# Patient Record
Sex: Male | Born: 1980 | Race: White | Hispanic: No | Marital: Single | Smoking: Former smoker
Health system: Southern US, Community
[De-identification: ages and names within clinical notes are randomized; demographics above are authoritative.]

## PROBLEM LIST (undated history)

## (undated) DIAGNOSIS — F329 Major depressive disorder, single episode, unspecified: Secondary | ICD-10-CM

## (undated) DIAGNOSIS — F419 Anxiety disorder, unspecified: Secondary | ICD-10-CM

## (undated) DIAGNOSIS — F32A Depression, unspecified: Secondary | ICD-10-CM

## (undated) HISTORY — PX: MOLE REMOVAL: SHX2046

---
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
## 2002-03-31 ENCOUNTER — Ambulatory Visit: Admission: RE | Admit: 2002-03-31 | Discharge: 2002-03-31 | Payer: Self-pay | Admitting: Family Medicine

## 2002-09-22 ENCOUNTER — Ambulatory Visit: Admission: RE | Admit: 2002-09-22 | Discharge: 2002-09-22 | Payer: Self-pay | Admitting: Pulmonary Disease

## 2011-07-03 DIAGNOSIS — D485 Neoplasm of uncertain behavior of skin: Secondary | ICD-10-CM | POA: Diagnosis not present

## 2011-07-03 DIAGNOSIS — D239 Other benign neoplasm of skin, unspecified: Secondary | ICD-10-CM | POA: Diagnosis not present

## 2011-07-03 DIAGNOSIS — D2360 Other benign neoplasm of skin of unspecified upper limb, including shoulder: Secondary | ICD-10-CM | POA: Diagnosis not present

## 2011-10-28 DIAGNOSIS — F321 Major depressive disorder, single episode, moderate: Secondary | ICD-10-CM | POA: Diagnosis not present

## 2011-10-31 DIAGNOSIS — D485 Neoplasm of uncertain behavior of skin: Secondary | ICD-10-CM | POA: Diagnosis not present

## 2011-11-28 DIAGNOSIS — D485 Neoplasm of uncertain behavior of skin: Secondary | ICD-10-CM | POA: Diagnosis not present

## 2012-02-13 DIAGNOSIS — Z23 Encounter for immunization: Secondary | ICD-10-CM | POA: Diagnosis not present

## 2012-04-13 DIAGNOSIS — F321 Major depressive disorder, single episode, moderate: Secondary | ICD-10-CM | POA: Diagnosis not present

## 2012-07-13 ENCOUNTER — Encounter: Payer: Self-pay | Admitting: Pulmonary Disease

## 2012-08-26 DIAGNOSIS — F848 Other pervasive developmental disorders: Secondary | ICD-10-CM | POA: Diagnosis not present

## 2012-08-26 DIAGNOSIS — Z733 Stress, not elsewhere classified: Secondary | ICD-10-CM | POA: Diagnosis not present

## 2012-08-27 DIAGNOSIS — F848 Other pervasive developmental disorders: Secondary | ICD-10-CM | POA: Diagnosis not present

## 2012-08-27 DIAGNOSIS — Z733 Stress, not elsewhere classified: Secondary | ICD-10-CM | POA: Diagnosis not present

## 2012-08-28 DIAGNOSIS — Z733 Stress, not elsewhere classified: Secondary | ICD-10-CM | POA: Diagnosis not present

## 2012-08-28 DIAGNOSIS — F848 Other pervasive developmental disorders: Secondary | ICD-10-CM | POA: Diagnosis not present

## 2012-09-28 DIAGNOSIS — F321 Major depressive disorder, single episode, moderate: Secondary | ICD-10-CM | POA: Diagnosis not present

## 2012-10-28 DIAGNOSIS — F840 Autistic disorder: Secondary | ICD-10-CM | POA: Diagnosis not present

## 2012-11-20 DIAGNOSIS — F840 Autistic disorder: Secondary | ICD-10-CM | POA: Diagnosis not present

## 2013-02-10 DIAGNOSIS — Z23 Encounter for immunization: Secondary | ICD-10-CM | POA: Diagnosis not present

## 2013-03-15 DIAGNOSIS — F321 Major depressive disorder, single episode, moderate: Secondary | ICD-10-CM | POA: Diagnosis not present

## 2013-06-23 DIAGNOSIS — J019 Acute sinusitis, unspecified: Secondary | ICD-10-CM | POA: Diagnosis not present

## 2013-09-29 DIAGNOSIS — F321 Major depressive disorder, single episode, moderate: Secondary | ICD-10-CM | POA: Diagnosis not present

## 2014-02-08 DIAGNOSIS — Z23 Encounter for immunization: Secondary | ICD-10-CM | POA: Diagnosis not present

## 2014-03-16 DIAGNOSIS — F321 Major depressive disorder, single episode, moderate: Secondary | ICD-10-CM | POA: Diagnosis not present

## 2014-08-31 DIAGNOSIS — F321 Major depressive disorder, single episode, moderate: Secondary | ICD-10-CM | POA: Diagnosis not present

## 2015-02-13 DIAGNOSIS — Z23 Encounter for immunization: Secondary | ICD-10-CM | POA: Diagnosis not present

## 2015-02-15 DIAGNOSIS — F321 Major depressive disorder, single episode, moderate: Secondary | ICD-10-CM | POA: Diagnosis not present

## 2015-02-22 DIAGNOSIS — Z1389 Encounter for screening for other disorder: Secondary | ICD-10-CM | POA: Diagnosis not present

## 2015-02-22 DIAGNOSIS — R030 Elevated blood-pressure reading, without diagnosis of hypertension: Secondary | ICD-10-CM | POA: Diagnosis not present

## 2015-02-22 DIAGNOSIS — Z0000 Encounter for general adult medical examination without abnormal findings: Secondary | ICD-10-CM | POA: Diagnosis not present

## 2015-08-30 DIAGNOSIS — F321 Major depressive disorder, single episode, moderate: Secondary | ICD-10-CM | POA: Diagnosis not present

## 2015-10-02 DIAGNOSIS — J069 Acute upper respiratory infection, unspecified: Secondary | ICD-10-CM | POA: Diagnosis not present

## 2015-10-02 DIAGNOSIS — J309 Allergic rhinitis, unspecified: Secondary | ICD-10-CM | POA: Diagnosis not present

## 2016-02-20 DIAGNOSIS — J029 Acute pharyngitis, unspecified: Secondary | ICD-10-CM | POA: Diagnosis not present

## 2016-02-21 DIAGNOSIS — Z23 Encounter for immunization: Secondary | ICD-10-CM | POA: Diagnosis not present

## 2016-03-05 DIAGNOSIS — F321 Major depressive disorder, single episode, moderate: Secondary | ICD-10-CM | POA: Diagnosis not present

## 2016-08-27 DIAGNOSIS — F321 Major depressive disorder, single episode, moderate: Secondary | ICD-10-CM | POA: Diagnosis not present

## 2017-01-21 DIAGNOSIS — Z23 Encounter for immunization: Secondary | ICD-10-CM | POA: Diagnosis not present

## 2017-04-29 DIAGNOSIS — F321 Major depressive disorder, single episode, moderate: Secondary | ICD-10-CM | POA: Diagnosis not present

## 2017-09-14 ENCOUNTER — Other Ambulatory Visit: Payer: Self-pay

## 2017-09-14 ENCOUNTER — Encounter (HOSPITAL_COMMUNITY): Payer: Self-pay | Admitting: Emergency Medicine

## 2017-09-14 ENCOUNTER — Emergency Department (HOSPITAL_COMMUNITY)
Admission: EM | Admit: 2017-09-14 | Discharge: 2017-09-15 | Disposition: A | Discharge status: Home / Self Care | Payer: Medicare Other | Attending: Emergency Medicine | Admitting: Emergency Medicine

## 2017-09-14 DIAGNOSIS — S81811A Laceration without foreign body, right lower leg, initial encounter: Secondary | ICD-10-CM | POA: Diagnosis not present

## 2017-09-14 DIAGNOSIS — Y9289 Other specified places as the place of occurrence of the external cause: Secondary | ICD-10-CM | POA: Insufficient documentation

## 2017-09-14 DIAGNOSIS — W540XXA Bitten by dog, initial encounter: Secondary | ICD-10-CM | POA: Insufficient documentation

## 2017-09-14 DIAGNOSIS — M79605 Pain in left leg: Secondary | ICD-10-CM | POA: Diagnosis not present

## 2017-09-14 DIAGNOSIS — Z87891 Personal history of nicotine dependence: Secondary | ICD-10-CM | POA: Diagnosis not present

## 2017-09-14 DIAGNOSIS — Z23 Encounter for immunization: Secondary | ICD-10-CM | POA: Insufficient documentation

## 2017-09-14 DIAGNOSIS — S8992XA Unspecified injury of left lower leg, initial encounter: Secondary | ICD-10-CM | POA: Diagnosis present

## 2017-09-14 DIAGNOSIS — S81832A Puncture wound without foreign body, left lower leg, initial encounter: Secondary | ICD-10-CM | POA: Diagnosis not present

## 2017-09-14 DIAGNOSIS — Y999 Unspecified external cause status: Secondary | ICD-10-CM | POA: Insufficient documentation

## 2017-09-14 DIAGNOSIS — S81852A Open bite, left lower leg, initial encounter: Secondary | ICD-10-CM | POA: Diagnosis not present

## 2017-09-14 DIAGNOSIS — Y9355 Activity, bike riding: Secondary | ICD-10-CM | POA: Diagnosis not present

## 2017-09-14 NOTE — ED Triage Notes (Signed)
Patient bitten on the back of his L lower leg. Animal Control contacted by [employer]. Patient seen by paramedics and sent to ED.

## 2017-09-15 DIAGNOSIS — S81832A Puncture wound without foreign body, left lower leg, initial encounter: Secondary | ICD-10-CM | POA: Diagnosis not present

## 2017-09-15 MED ORDER — AMOXICILLIN-POT CLAVULANATE 875-125 MG PO TABS
1.0000 | ORAL_TABLET | Freq: Once | ORAL | Status: AC
  Administered 2017-09-15: 1 via ORAL
  Filled 2017-09-15: qty 1

## 2017-09-15 MED ORDER — IBUPROFEN 800 MG PO TABS: 800.0000 mg | ORAL_TABLET | Freq: Four times a day (QID) | ORAL | 0 refills | Status: DC | PRN

## 2017-09-15 MED ORDER — BACITRACIN-NEOMYCIN-POLYMYXIN 400-5-5000 EX OINT
TOPICAL_OINTMENT | Freq: Once | CUTANEOUS | Status: AC
  Administered 2017-09-15: 1 via TOPICAL
  Filled 2017-09-15: qty 1

## 2017-09-15 MED ORDER — AMOXICILLIN-POT CLAVULANATE 875-125 MG PO TABS: 1.0000 | ORAL_TABLET | Freq: Two times a day (BID) | ORAL | 0 refills | Status: DC

## 2017-09-15 MED ORDER — TETANUS-DIPHTH-ACELL PERTUSSIS 5-2.5-18.5 LF-MCG/0.5 IM SUSP
0.5000 mL | Freq: Once | INTRAMUSCULAR | Status: AC
  Administered 2017-09-15: 0.5 mL via INTRAMUSCULAR
  Filled 2017-09-15: qty 0.5
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
## 2017-09-15 NOTE — ED Notes (Signed)
Wound cleaned with saline, nonadherent dressing applied

## 2018-01-06 DIAGNOSIS — F321 Major depressive disorder, single episode, moderate: Secondary | ICD-10-CM | POA: Diagnosis not present

## 2018-01-16 DIAGNOSIS — Z23 Encounter for immunization: Secondary | ICD-10-CM | POA: Diagnosis not present

## 2018-12-24 DIAGNOSIS — E785 Hyperlipidemia, unspecified: Secondary | ICD-10-CM | POA: Diagnosis not present

## 2018-12-24 DIAGNOSIS — Z0000 Encounter for general adult medical examination without abnormal findings: Secondary | ICD-10-CM | POA: Diagnosis not present

## 2019-01-05 ENCOUNTER — Encounter: Payer: Self-pay | Admitting: Psychiatry

## 2019-01-05 ENCOUNTER — Ambulatory Visit (INDEPENDENT_AMBULATORY_CARE_PROVIDER_SITE_OTHER): Payer: Medicare Other | Admitting: Psychiatry

## 2019-01-05 ENCOUNTER — Other Ambulatory Visit: Payer: Self-pay

## 2019-01-05 DIAGNOSIS — F959 Tic disorder, unspecified: Secondary | ICD-10-CM

## 2019-01-05 DIAGNOSIS — G4726 Circadian rhythm sleep disorder, shift work type: Secondary | ICD-10-CM | POA: Diagnosis not present

## 2019-01-05 DIAGNOSIS — F0630 Mood disorder due to known physiological condition, unspecified: Secondary | ICD-10-CM | POA: Diagnosis not present

## 2019-01-05 MED ORDER — MODAFINIL 100 MG PO TABS: 100.0000 mg | ORAL_TABLET | Freq: Every day | ORAL | 1 refills | Status: DC

## 2019-01-05 MED ORDER — FLUOXETINE HCL 10 MG PO CAPS: 10.0000 mg | ORAL_CAPSULE | Freq: Every day | ORAL | 3 refills | Status: DC

## 2019-01-05 NOTE — Progress Notes (Signed)
TRAPPER GOING [id number] 1981/03/07 38 y.o.  Subjective:   Patient ID:  Micheal Mcgee is a 38 y.o. (DOB 02-07-1981) male.  Chief Complaint:  Chief Complaint  Patient presents with  . Follow-up     Medication Management    HPI [name] presents to the office today for follow-up of shift work sleep disorder, tic disorder, mood disorder NOS, and Asberger's.  Last seen August 2019.  No meds were changed.  He remains on fluoxetine 10 mg and modafinil 100 mg.  He has been on the current meds without change since 2007  Still working now.  Brief lay off.  Modafanil still helps alertness.  No excessive sleepiness driving.  Occ anger but no severe outbursts as in the past.  Patient reports stable mood and denies depressed or irritable moods. No unusual stressors except Covid.  Patient denies any recent difficulty with anxiety.  Patient denies difficulty with sleep initiation or maintenance. Denies appetite disturbance.  Patient reports that energy and motivation have been good.  Patient denies any difficulty with concentration.  Patient denies any suicidal ideation.  Past Psychiatric Medication Trials: Citalopram, venlafaxine, sertraline, fluoxetine, Adderall, Concerta, modafinil, risperidone 2, temazepam  Review of Systems:  Review of Systems  Neurological: Negative for tremors and weakness.  Psychiatric/Behavioral: Negative for agitation and behavioral problems.    Medications: I have reviewed the patient's current medications.  Current Outpatient Medications  Medication Sig Dispense Refill  . FLUoxetine (PROZAC) 10 MG capsule Take 10 mg by mouth daily.    . modafinil (PROVIGIL) 100 MG tablet Take 100 mg by mouth daily.     No current facility-administered medications for this visit.     Medication Side Effects: None  Allergies: No Known Allergies  History reviewed. No pertinent past medical history.  Family History  Problem Relation Age of Onset  . Diabetes Mother   .  Diabetes Father   . Cancer Father     Social History   Socioeconomic History  . Marital status: Single    Spouse name: Not on file  . Number of children: Not on file  . Years of education: Not on file  . Highest education level: Not on file  Occupational History  . Not on file  Social Needs  . Financial resource strain: Not on file  . Food insecurity    Worry: Not on file    Inability: Not on file  . Transportation needs    Medical: Not on file    Non-medical: Not on file  Tobacco Use  . Smoking status: Former Research scientist (life sciences)  . Smokeless tobacco: Never Used  Substance and Sexual Activity  . Alcohol use: Yes    Comment: occas  . Drug use: Never  . Sexual activity: Not on file  Lifestyle  . Physical activity    Days per week: Not on file    Minutes per session: Not on file  . Stress: Not on file  Relationships  . Social Herbalist on phone: Not on file    Gets together: Not on file    Attends religious service: Not on file    Active member of club or organization: Not on file    Attends meetings of clubs or organizations: Not on file    Relationship status: Not on file  . Intimate partner violence    Fear of current or ex partner: Not on file    Emotionally abused: Not on file    Physically abused:  Not on file    Forced sexual activity: Not on file  Other Topics Concern  . Not on file  Social History Narrative  . Not on file    Past Medical History, Surgical history, Social history, and Family history were reviewed and updated as appropriate.   Please see review of systems for further details on the patient's review from today.   Objective:   Physical Exam:  There were no vitals taken for this visit.  Physical Exam Constitutional:      General: He is not in acute distress.    Appearance: He is well-developed.  Musculoskeletal:        General: No deformity.  Neurological:     Mental Status: He is alert and oriented to person, place, and time.      Coordination: Coordination normal.  Psychiatric:        Attention and Perception: Attention and perception normal. He does not perceive auditory or visual hallucinations.        Mood and Affect: Mood is not anxious or depressed. Affect is not labile, blunt, angry or inappropriate.        Speech: Speech normal.        Behavior: Behavior normal. Behavior is not agitated.        Thought Content: Thought content normal. Thought content is not paranoid or delusional. Thought content does not include homicidal or suicidal ideation. Thought content does not include homicidal or suicidal plan.        Cognition and Memory: Cognition and memory normal.        Judgment: Judgment normal.     Comments: Insight fair to good. Mildly unusual affect and social deficits. No agitation     Lab Review:  No results found for: NA, K, CL, CO2, GLUCOSE, BUN, CREATININE, CALCIUM, PROT, ALBUMIN, AST, ALT, ALKPHOS, BILITOT, GFRNONAA, GFRAA  No results found for: WBC, RBC, HGB, HCT, PLT, MCV, MCH, MCHC, RDW, LYMPHSABS, MONOABS, EOSABS, BASOSABS  No results found for: POCLITH, LITHIUM   No results found for: PHENYTOIN, PHENOBARB, VALPROATE, CBMZ   .res Assessment: Plan:    Mariusz was seen today for follow-up.  Diagnoses and all orders for this visit:  Shift work sleep disorder  Tic disorder  Mood disorder in conditions classified elsewhere  Patient is a history of Asperger's syndrome which was associated with impulsive and potentially dangerous behavior.  However with age and the addition of fluoxetine he no longer has acting out behaviors.  He still has other symptoms related to Asberger's but he is functioning adequately at this point.  He still lives with his mother.  The modafinil is working well for his shift work sleep disorder.  He is getting an adequate amount of sleep.  He is tolerating the meds.  His tics are not significantly bothersome and do not warrant treatment at this point.  He has taken  risperidone in the past both for tics and impulsive behaviors but he no longer needs that medication.  Overall meds still helpful and risk of changes are greater than risk of continuing meds.  FU 1 year  Lynder Parents, MD, DFAPA  Please see After Visit Summary for patient specific instructions.  No future appointments.  No orders of the defined types were placed in this encounter.   -------------------------------

## 2019-01-11 DIAGNOSIS — Z23 Encounter for immunization: Secondary | ICD-10-CM | POA: Diagnosis not present

## 2019-01-19 ENCOUNTER — Emergency Department (HOSPITAL_COMMUNITY)
Admission: EM | Admit: 2019-01-19 | Discharge: 2019-01-19 | Disposition: A | Discharge status: Home / Self Care | Payer: Medicare Other | Attending: Emergency Medicine | Admitting: Emergency Medicine

## 2019-01-19 ENCOUNTER — Emergency Department (HOSPITAL_COMMUNITY): Payer: Medicare Other

## 2019-01-19 ENCOUNTER — Other Ambulatory Visit: Payer: Self-pay

## 2019-01-19 ENCOUNTER — Encounter (HOSPITAL_COMMUNITY): Payer: Self-pay | Admitting: *Deleted

## 2019-01-19 DIAGNOSIS — S42021A Displaced fracture of shaft of right clavicle, initial encounter for closed fracture: Secondary | ICD-10-CM

## 2019-01-19 DIAGNOSIS — Y9355 Activity, bike riding: Secondary | ICD-10-CM | POA: Diagnosis not present

## 2019-01-19 DIAGNOSIS — Z87891 Personal history of nicotine dependence: Secondary | ICD-10-CM | POA: Insufficient documentation

## 2019-01-19 DIAGNOSIS — Y92410 Unspecified street and highway as the place of occurrence of the external cause: Secondary | ICD-10-CM | POA: Diagnosis not present

## 2019-01-19 DIAGNOSIS — Y999 Unspecified external cause status: Secondary | ICD-10-CM | POA: Diagnosis not present

## 2019-01-19 DIAGNOSIS — S4991XA Unspecified injury of right shoulder and upper arm, initial encounter: Secondary | ICD-10-CM | POA: Diagnosis present

## 2019-01-19 MED ORDER — HYDROCODONE-ACETAMINOPHEN 5-325 MG PO TABS
1.0000 | ORAL_TABLET | Freq: Once | ORAL | Status: AC
  Administered 2019-01-19: 02:00:00 1 via ORAL
  Filled 2019-01-19: qty 1

## 2019-01-19 MED ORDER — HYDROCODONE-ACETAMINOPHEN 5-325 MG PO TABS: 1.0000 | ORAL_TABLET | ORAL | 0 refills | Status: DC | PRN
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
## 2019-01-19 NOTE — Discharge Instructions (Signed)
As we discussed, you have a severe fracture of your collar bone. It is at risk for poking through the skin. Wear the sling and do not move the arm as much as possible. Apply ice and take the pain medication as prescribed. If you notice the bone poking through the skin or any bleeding from the area, return to the ED right away.
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
## 2019-01-20 ENCOUNTER — Other Ambulatory Visit: Payer: Self-pay | Admitting: Physician Assistant

## 2019-01-20 ENCOUNTER — Other Ambulatory Visit (HOSPITAL_COMMUNITY)
Admission: RE | Admit: 2019-01-20 | Discharge: 2019-01-20 | Disposition: A | Discharge status: Home / Self Care | Payer: Medicare Other | Source: Ambulatory Visit | Attending: Orthopaedic Surgery | Admitting: Orthopaedic Surgery

## 2019-01-20 ENCOUNTER — Ambulatory Visit (INDEPENDENT_AMBULATORY_CARE_PROVIDER_SITE_OTHER): Payer: Medicare Other | Admitting: Orthopaedic Surgery

## 2019-01-20 ENCOUNTER — Encounter: Payer: Self-pay | Admitting: Orthopaedic Surgery

## 2019-01-20 DIAGNOSIS — Z20828 Contact with and (suspected) exposure to other viral communicable diseases: Secondary | ICD-10-CM | POA: Insufficient documentation

## 2019-01-20 DIAGNOSIS — S42021A Displaced fracture of shaft of right clavicle, initial encounter for closed fracture: Secondary | ICD-10-CM

## 2019-01-20 DIAGNOSIS — Z01812 Encounter for preprocedural laboratory examination: Secondary | ICD-10-CM | POA: Insufficient documentation

## 2019-01-20 LAB — SARS CORONAVIRUS 2 (TAT 6-24 HRS): SARS Coronavirus 2: NEGATIVE
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
## 2019-01-21 ENCOUNTER — Encounter (HOSPITAL_COMMUNITY): Payer: Self-pay | Admitting: *Deleted

## 2019-01-21 ENCOUNTER — Encounter (HOSPITAL_COMMUNITY)
Admission: RE | Admit: 2019-01-21 | Discharge: 2019-01-21 | Disposition: A | Discharge status: Home / Self Care | Payer: Medicare Other | Source: Ambulatory Visit | Attending: Orthopaedic Surgery | Admitting: Orthopaedic Surgery

## 2019-01-21 ENCOUNTER — Other Ambulatory Visit: Payer: Self-pay

## 2019-01-21 ENCOUNTER — Encounter (HOSPITAL_COMMUNITY): Payer: Self-pay

## 2019-01-21 DIAGNOSIS — S42001B Fracture of unspecified part of right clavicle, initial encounter for open fracture: Secondary | ICD-10-CM | POA: Diagnosis not present

## 2019-01-21 DIAGNOSIS — Z01812 Encounter for preprocedural laboratory examination: Secondary | ICD-10-CM | POA: Insufficient documentation

## 2019-01-21 HISTORY — DX: Anxiety disorder, unspecified: F41.9

## 2019-01-21 HISTORY — DX: Depression, unspecified: F32.A

## 2019-01-21 LAB — CBC
HCT: 46 % (ref 39.0–52.0)
Hemoglobin: 15.6 g/dL (ref 13.0–17.0)
MCH: 32.1 pg (ref 26.0–34.0)
MCHC: 33.9 g/dL (ref 30.0–36.0)
MCV: 94.7 fL (ref 80.0–100.0)
Platelets: 210 10*3/uL (ref 150–400)
RBC: 4.86 MIL/uL (ref 4.22–5.81)
RDW: 12.9 % (ref 11.5–15.5)
WBC: 7.2 10*3/uL (ref 4.0–10.5)
nRBC: 0 % (ref 0.0–0.2)

## 2019-01-21 LAB — BASIC METABOLIC PANEL
Anion gap: 11 (ref 5–15)
BUN: 11 mg/dL (ref 6–20)
CO2: 26 mmol/L (ref 22–32)
Calcium: 9 mg/dL (ref 8.9–10.3)
Chloride: 102 mmol/L (ref 98–111)
Creatinine, Ser: 0.86 mg/dL (ref 0.61–1.24)
GFR calc Af Amer: >60 mL/min (ref >60)
GFR calc non Af Amer: >60 mL/min (ref >60)
Glucose, Bld: 134 mg/dL — ABNORMAL HIGH (ref 70–99)
Potassium: 4.1 mmol/L (ref 3.5–5.1)
Sodium: 139 mmol/L (ref 135–145)

## 2019-01-21 NOTE — Progress Notes (Signed)
Pt aware of surgical time change and to arrive at [facility] admitting by 0715 on 01/22/2019. Pt aware to consume ERAS drink by 0630 to 0645 and to take medications as instructed during PAT appt. Otherwise no food or drink after midnight. Pt verbalized understanding.

## 2019-01-21 NOTE — Patient Instructions (Addendum)
DUE TO COVID-19 ONLY ONE VISITOR IS ALLOWED TO COME WITH YOU AND STAY IN THE WAITING ROOM ONLY DURING PRE OP AND PROCEDURE DAY OF SURGERY. THE 1 VISITOR MAY VISIT WITH YOU AFTER SURGERY IN YOUR PRIVATE ROOM DURING VISITING HOURS ONLY!   ONCE YOUR COVID TEST IS COMPLETED, PLEASE BEGIN THE QUARANTINE INSTRUCTIONS AS OUTLINED IN YOUR HANDOUT.                Micheal Mcgee  01/21/2019   Your procedure is scheduled on: Friday 01/22/2019   Report to [facility] Main  Entrance              Report to admitting at 1015 AM     Call this number if you have problems the morning of surgery [phone]    Remember: Do not eat food  :After Midnight.               NO SOLID FOOD AFTER MIDNIGHT THE NIGHT PRIOR TO SURGERY. NOTHING BY MOUTH EXCEPT CLEAR LIQUIDS FROM MIDNIGHT UP  UNTIL 0915 AM .              PLEASE FINISH ENSURE DRINK PER SURGEON ORDER  WHICH NEEDS TO BE COMPLETED AT  0915 AM.   CLEAR LIQUID DIET   Foods Allowed                                                                     Foods Excluded  Coffee and tea, regular and decaf                             liquids that you cannot  Plain Jell-O any favor except red or purple                                           see through such as: Fruit ices (not with fruit pulp)                                     milk, soups, orange juice  Iced Popsicles                                    All solid food Carbonated beverages, regular and diet                                    Cranberry, grape and apple juices Sports drinks like Gatorade Lightly seasoned clear broth or consume(fat free) Sugar, honey syrup  Sample Menu Breakfast                                Lunch  Supper Cranberry juice                    Beef broth                            Chicken broth Jell-O                                     Grape juice                           Apple juice Coffee or tea                        Jell-O                                       Popsicle                                                Coffee or tea                        Coffee or tea  _____________________________________________________________________               BRUSH YOUR TEETH MORNING OF SURGERY AND RINSE YOUR MOUTH OUT, NO CHEWING GUM CANDY OR MINTS.     Take these medicines the morning of surgery with A SIP OF WATER: Fluoxetine (Prozac)                                 You may not have any metal on your body including hair pins and              piercings  Do not wear jewelry, make-up, lotions, powders or perfumes, deodorant                       Men may shave face and neck.   Do not bring valuables to the hospital. [place].  Contacts, dentures or bridgework may not be worn into surgery.  Leave suitcase in the car. After surgery it may be brought to your room.                  Please read over the following fact sheets you were given: _____________________________________________________________________             [facility] - Preparing for Surgery Before surgery, you can play an important role.  Because skin is not sterile, your skin needs to be as free of germs as possible.  You can reduce the number of germs on your skin by washing with CHG (chlorahexidine gluconate) soap before surgery.  CHG is an antiseptic cleaner which kills germs and bonds with the skin to continue killing germs even after washing. Please DO NOT use if you have an allergy to CHG or antibacterial soaps.  If your skin becomes reddened/irritated stop  using the CHG and inform your nurse when you arrive at Short Stay. Do not shave (including legs and underarms) for at least 48 hours prior to the first CHG shower.  You may shave your face/neck. Please follow these instructions carefully:  1.  Shower with CHG Soap the night before surgery and the  morning of Surgery.  2.  If you choose to wash  your hair, wash your hair first as usual with your  normal  shampoo.  3.  After you shampoo, rinse your hair and body thoroughly to remove the  shampoo.                           4.  Use CHG as you would any other liquid soap.  You can apply chg directly  to the skin and wash                       Gently with a scrungie or clean washcloth.  5.  Apply the CHG Soap to your body ONLY FROM THE NECK DOWN.   Do not use on face/ open                           Wound or open sores. Avoid contact with eyes, ears mouth and genitals (private parts).                       Wash face,  Genitals (private parts) with your normal soap.             6.  Wash thoroughly, paying special attention to the area where your surgery  will be performed.  7.  Thoroughly rinse your body with warm water from the neck down.  8.  DO NOT shower/wash with your normal soap after using and rinsing off  the CHG Soap.                9.  Pat yourself dry with a clean towel.            10.  Wear clean pajamas.            11.  Place clean sheets on your bed the night of your first shower and do not  sleep with pets. Day of Surgery : Do not apply any lotions/deodorants the morning of surgery.  Please wear clean clothes to the hospital/surgery center.  FAILURE TO FOLLOW THESE INSTRUCTIONS MAY RESULT IN THE CANCELLATION OF YOUR SURGERY PATIENT SIGNATURE_________________________________  NURSE SIGNATURE__________________________________  ________________________________________________________________________   Micheal Mcgee  An incentive spirometer is a tool that can help keep your lungs clear and active. This tool measures how well you are filling your lungs with each breath. Taking long deep breaths may help reverse or decrease the chance of developing breathing (pulmonary) problems (especially infection) following:  A long period of time when you are unable to move or be active. BEFORE THE PROCEDURE   If the spirometer  includes an indicator to show your best effort, your nurse or respiratory therapist will set it to a desired goal.  If possible, sit up straight or lean slightly forward. Try not to slouch.  Hold the incentive spirometer in an upright position. INSTRUCTIONS FOR USE  1. Sit on the edge of your bed if possible, or sit up as far as you can in bed or on a chair. 2. Hold the incentive  spirometer in an upright position. 3. Breathe out normally. 4. Place the mouthpiece in your mouth and seal your lips tightly around it. 5. Breathe in slowly and as deeply as possible, raising the piston or the ball toward the top of the column. 6. Hold your breath for 3-5 seconds or for as long as possible. Allow the piston or ball to fall to the bottom of the column. 7. Remove the mouthpiece from your mouth and breathe out normally. 8. Rest for a few seconds and repeat Steps 1 through 7 at least 10 times every 1-2 hours when you are awake. Take your time and take a few normal breaths between deep breaths. 9. The spirometer may include an indicator to show your best effort. Use the indicator as a goal to work toward during each repetition. 10. After each set of 10 deep breaths, practice coughing to be sure your lungs are clear. If you have an incision (the cut made at the time of surgery), support your incision when coughing by placing a pillow or rolled up towels firmly against it. Once you are able to get out of bed, walk around indoors and cough well. You may stop using the incentive spirometer when instructed by your caregiver.  RISKS AND COMPLICATIONS  Take your time so you do not get dizzy or light-headed.  If you are in pain, you may need to take or ask for pain medication before doing incentive spirometry. It is harder to take a deep breath if you are having pain. AFTER USE  Rest and breathe slowly and easily.  It can be helpful to keep track of a log of your progress. Your caregiver can provide you with a  simple table to help with this. If you are using the spirometer at home, follow these instructions: [place] IF:   You are having difficultly using the spirometer.  You have trouble using the spirometer as often as instructed.  Your pain medication is not giving enough relief while using the spirometer.  You develop fever of 100.5 F (38.1 C) or higher. SEEK IMMEDIATE MEDICAL CARE IF:   You cough up bloody sputum that had not been present before.  You develop fever of 102 F (38.9 C) or greater.  You develop worsening pain at or near the incision site. MAKE SURE YOU:   Understand these instructions.  Will watch your condition.  Will get help right away if you are not doing well or get worse. Document Released: 09/09/2006 Document Revised: 07/22/2011 Document Reviewed: 11/10/2006 [facility] Patient Information 2014 [place], [place].   ________________________________________________________________________

## 2019-01-21 NOTE — Progress Notes (Signed)
PCP - Dr. Jeremy Johann Cardiologist -N/A   Chest x-ray -N/A  EKG - N/A Stress Test - N/A ECHO - N/A Cardiac Cath - N/A  Sleep Study -N/A  CPAP -   Fasting Blood Sugar - N/A Checks Blood Sugar _____ times a day  Blood Thinner Instructions:N/A Aspirin Instructions: Last Dose:  Anesthesia review:   Patient denies shortness of breath, fever, cough and chest pain at PAT appointment   Patient verbalized understanding of instructions that were given to them at the PAT appointment. Patient was also instructed that they will need to review over the PAT instructions again at home before surgery.

## 2019-01-22 ENCOUNTER — Other Ambulatory Visit: Payer: Self-pay

## 2019-01-22 ENCOUNTER — Ambulatory Visit (HOSPITAL_COMMUNITY): Payer: Medicare Other | Admitting: Anesthesiology

## 2019-01-22 ENCOUNTER — Encounter (HOSPITAL_COMMUNITY)
Admission: AC | Disposition: A | Discharge status: Home / Self Care | Payer: Self-pay | Source: Home / Self Care | Attending: Orthopaedic Surgery

## 2019-01-22 ENCOUNTER — Encounter (HOSPITAL_COMMUNITY): Payer: Self-pay | Admitting: *Deleted

## 2019-01-22 ENCOUNTER — Ambulatory Visit (HOSPITAL_COMMUNITY): Payer: Medicare Other

## 2019-01-22 ENCOUNTER — Observation Stay (HOSPITAL_COMMUNITY)
Admission: AC | Admit: 2019-01-22 | Discharge: 2019-01-23 | Disposition: A | Discharge status: Home / Self Care | Payer: Medicare Other | Attending: Orthopaedic Surgery | Admitting: Orthopaedic Surgery

## 2019-01-22 DIAGNOSIS — Z79899 Other long term (current) drug therapy: Secondary | ICD-10-CM | POA: Diagnosis not present

## 2019-01-22 DIAGNOSIS — S42021A Displaced fracture of shaft of right clavicle, initial encounter for closed fracture: Principal | ICD-10-CM | POA: Insufficient documentation

## 2019-01-22 DIAGNOSIS — Z87891 Personal history of nicotine dependence: Secondary | ICD-10-CM | POA: Insufficient documentation

## 2019-01-22 DIAGNOSIS — S42009A Fracture of unspecified part of unspecified clavicle, initial encounter for closed fracture: Secondary | ICD-10-CM

## 2019-01-22 DIAGNOSIS — F329 Major depressive disorder, single episode, unspecified: Secondary | ICD-10-CM | POA: Diagnosis not present

## 2019-01-22 DIAGNOSIS — S42001A Fracture of unspecified part of right clavicle, initial encounter for closed fracture: Secondary | ICD-10-CM | POA: Diagnosis present

## 2019-01-22 DIAGNOSIS — F419 Anxiety disorder, unspecified: Secondary | ICD-10-CM | POA: Diagnosis not present

## 2019-01-22 DIAGNOSIS — F418 Other specified anxiety disorders: Secondary | ICD-10-CM | POA: Diagnosis not present

## 2019-01-22 DIAGNOSIS — Y9355 Activity, bike riding: Secondary | ICD-10-CM | POA: Diagnosis not present

## 2019-01-22 DIAGNOSIS — S42001D Fracture of unspecified part of right clavicle, subsequent encounter for fracture with routine healing: Secondary | ICD-10-CM | POA: Diagnosis not present

## 2019-01-22 HISTORY — PX: ORIF CLAVICULAR FRACTURE: SHX5055

## 2019-01-22 SURGERY — OPEN REDUCTION INTERNAL FIXATION (ORIF) CLAVICULAR FRACTURE
Anesthesia: General | Site: Shoulder | Laterality: Right

## 2019-01-22 MED ORDER — BUPIVACAINE HCL (PF) 0.25 % IJ SOLN
INTRAMUSCULAR | Status: AC
  Filled 2019-01-22: qty 30

## 2019-01-22 MED ORDER — SODIUM CHLORIDE 0.9 % IV SOLN
INTRAVENOUS | Status: DC
  Administered 2019-01-22: 18:00:00 via INTRAVENOUS

## 2019-01-22 MED ORDER — 0.9 % SODIUM CHLORIDE (POUR BTL) OPTIME
TOPICAL | Status: DC | PRN
  Administered 2019-01-22: 12:00:00 1000 mL

## 2019-01-22 MED ORDER — CEFAZOLIN SODIUM-DEXTROSE 1-4 GM/50ML-% IV SOLN
1.0000 g | Freq: Four times a day (QID) | INTRAVENOUS | Status: AC
  Administered 2019-01-22 – 2019-01-23 (×3): 1 g via INTRAVENOUS
  Filled 2019-01-22 (×3): qty 50

## 2019-01-22 MED ORDER — ACETAMINOPHEN 325 MG PO TABS
325.0000 mg | ORAL_TABLET | Freq: Four times a day (QID) | ORAL | Status: DC | PRN
  Filled 2019-01-22: qty 2

## 2019-01-22 MED ORDER — OXYCODONE HCL 5 MG PO TABS
5.0000 mg | ORAL_TABLET | ORAL | Status: DC | PRN
  Administered 2019-01-22: 19:00:00 5 mg via ORAL
  Administered 2019-01-22 – 2019-01-23 (×2): 10 mg via ORAL
  Filled 2019-01-22: qty 1
  Filled 2019-01-22 (×2): qty 2

## 2019-01-22 MED ORDER — ONDANSETRON HCL 4 MG PO TABS: 4.0000 mg | ORAL_TABLET | Freq: Four times a day (QID) | ORAL | Status: DC | PRN

## 2019-01-22 MED ORDER — PROPOFOL 10 MG/ML IV BOLUS
INTRAVENOUS | Status: DC | PRN
  Administered 2019-01-22: 200 mg via INTRAVENOUS

## 2019-01-22 MED ORDER — FENTANYL CITRATE (PF) 100 MCG/2ML IJ SOLN
INTRAMUSCULAR | Status: AC
  Filled 2019-01-22: qty 2

## 2019-01-22 MED ORDER — CEFAZOLIN SODIUM-DEXTROSE 2-4 GM/100ML-% IV SOLN
2.0000 g | INTRAVENOUS | Status: AC
  Administered 2019-01-22: 2 g via INTRAVENOUS
  Filled 2019-01-22: qty 100

## 2019-01-22 MED ORDER — OXYCODONE HCL 5 MG PO TABS: 10.0000 mg | ORAL_TABLET | ORAL | Status: DC | PRN

## 2019-01-22 MED ORDER — MIDAZOLAM HCL 2 MG/2ML IJ SOLN
INTRAMUSCULAR | Status: AC
  Filled 2019-01-22: qty 2

## 2019-01-22 MED ORDER — MODAFINIL 100 MG PO TABS: 100.0000 mg | ORAL_TABLET | Freq: Every day | ORAL | Status: DC

## 2019-01-22 MED ORDER — METHOCARBAMOL 500 MG PO TABS
500.0000 mg | ORAL_TABLET | Freq: Four times a day (QID) | ORAL | Status: DC | PRN
  Administered 2019-01-22 – 2019-01-23 (×2): 500 mg via ORAL
  Filled 2019-01-22 (×2): qty 1

## 2019-01-22 MED ORDER — FENTANYL CITRATE (PF) 100 MCG/2ML IJ SOLN
INTRAMUSCULAR | Status: DC | PRN
  Administered 2019-01-22 (×5): 50 ug via INTRAVENOUS
  Administered 2019-01-22: 25 ug via INTRAVENOUS
  Administered 2019-01-22: 50 ug via INTRAVENOUS
  Administered 2019-01-22: 25 ug via INTRAVENOUS
  Administered 2019-01-22 (×2): 50 ug via INTRAVENOUS

## 2019-01-22 MED ORDER — ONDANSETRON HCL 4 MG/2ML IJ SOLN: 4.0000 mg | Freq: Four times a day (QID) | INTRAMUSCULAR | Status: DC | PRN

## 2019-01-22 MED ORDER — DEXAMETHASONE SODIUM PHOSPHATE 10 MG/ML IJ SOLN
INTRAMUSCULAR | Status: DC | PRN
  Administered 2019-01-22: 10 mg via INTRAVENOUS

## 2019-01-22 MED ORDER — HYDROMORPHONE HCL 1 MG/ML IJ SOLN: 0.5000 mg | INTRAMUSCULAR | Status: DC | PRN

## 2019-01-22 MED ORDER — ONDANSETRON HCL 4 MG/2ML IJ SOLN
INTRAMUSCULAR | Status: AC
  Filled 2019-01-22: qty 2

## 2019-01-22 MED ORDER — METHOCARBAMOL 500 MG IVPB - SIMPLE MED
500.0000 mg | Freq: Four times a day (QID) | INTRAVENOUS | Status: DC | PRN
  Filled 2019-01-22: qty 50

## 2019-01-22 MED ORDER — DEXAMETHASONE SODIUM PHOSPHATE 10 MG/ML IJ SOLN
INTRAMUSCULAR | Status: AC
  Filled 2019-01-22: qty 1

## 2019-01-22 MED ORDER — FENTANYL CITRATE (PF) 250 MCG/5ML IJ SOLN
INTRAMUSCULAR | Status: AC
  Filled 2019-01-22: qty 5

## 2019-01-22 MED ORDER — FLUOXETINE HCL 10 MG PO CAPS
10.0000 mg | ORAL_CAPSULE | Freq: Every day | ORAL | Status: DC
  Administered 2019-01-23: 10 mg via ORAL
  Filled 2019-01-22 (×2): qty 1

## 2019-01-22 MED ORDER — LACTATED RINGERS IV SOLN
INTRAVENOUS | Status: DC
  Administered 2019-01-22 (×2): via INTRAVENOUS

## 2019-01-22 MED ORDER — PROPOFOL 10 MG/ML IV BOLUS
INTRAVENOUS | Status: AC
  Filled 2019-01-22: qty 20

## 2019-01-22 MED ORDER — MIDAZOLAM HCL 5 MG/5ML IJ SOLN
INTRAMUSCULAR | Status: DC | PRN
  Administered 2019-01-22: 2 mg via INTRAVENOUS

## 2019-01-22 MED ORDER — PHENYLEPHRINE 40 MCG/ML (10ML) SYRINGE FOR IV PUSH (FOR BLOOD PRESSURE SUPPORT)
PREFILLED_SYRINGE | INTRAVENOUS | Status: DC | PRN
  Administered 2019-01-22: 80 ug via INTRAVENOUS

## 2019-01-22 MED ORDER — BUPIVACAINE HCL (PF) 0.25 % IJ SOLN
INTRAMUSCULAR | Status: DC | PRN
  Administered 2019-01-22: 20 mL

## 2019-01-22 MED ORDER — FENTANYL CITRATE (PF) 100 MCG/2ML IJ SOLN
25.0000 ug | INTRAMUSCULAR | Status: DC | PRN
  Administered 2019-01-22 (×2): 50 ug via INTRAVENOUS

## 2019-01-22 MED ORDER — DOCUSATE SODIUM 100 MG PO CAPS
100.0000 mg | ORAL_CAPSULE | Freq: Two times a day (BID) | ORAL | Status: DC
  Administered 2019-01-22 – 2019-01-23 (×2): 100 mg via ORAL
  Filled 2019-01-22 (×2): qty 1

## 2019-01-22 MED ORDER — PHENYLEPHRINE 40 MCG/ML (10ML) SYRINGE FOR IV PUSH (FOR BLOOD PRESSURE SUPPORT)
PREFILLED_SYRINGE | INTRAVENOUS | Status: AC
  Filled 2019-01-22: qty 10

## 2019-01-22 MED ORDER — METOCLOPRAMIDE HCL 5 MG PO TABS: 5.0000 mg | ORAL_TABLET | Freq: Three times a day (TID) | ORAL | Status: DC | PRN

## 2019-01-22 MED ORDER — LIDOCAINE 2% (20 MG/ML) 5 ML SYRINGE
INTRAMUSCULAR | Status: AC
  Filled 2019-01-22: qty 5

## 2019-01-22 MED ORDER — METOCLOPRAMIDE HCL 5 MG/ML IJ SOLN: 5.0000 mg | Freq: Three times a day (TID) | INTRAMUSCULAR | Status: DC | PRN

## 2019-01-22 MED ORDER — ROCURONIUM BROMIDE 10 MG/ML (PF) SYRINGE
PREFILLED_SYRINGE | INTRAVENOUS | Status: AC
  Filled 2019-01-22: qty 10

## 2019-01-22 MED ORDER — ACETAMINOPHEN 500 MG PO TABS
1000.0000 mg | ORAL_TABLET | Freq: Once | ORAL | Status: AC
  Administered 2019-01-22: 10:00:00 1000 mg via ORAL
  Filled 2019-01-22: qty 2

## 2019-01-22 MED ORDER — ONDANSETRON HCL 4 MG/2ML IJ SOLN
INTRAMUSCULAR | Status: DC | PRN
  Administered 2019-01-22: 4 mg via INTRAVENOUS

## 2019-01-22 MED ORDER — DIPHENHYDRAMINE HCL 12.5 MG/5ML PO ELIX: 12.5000 mg | ORAL_SOLUTION | ORAL | Status: DC | PRN

## 2019-01-22 MED ORDER — CHLORHEXIDINE GLUCONATE 4 % EX LIQD: 60.0000 mL | Freq: Once | CUTANEOUS | Status: DC

## 2019-01-22 MED ORDER — MIDAZOLAM HCL 2 MG/2ML IJ SOLN
0.5000 mg | Freq: Once | INTRAMUSCULAR | Status: AC
  Administered 2019-01-22: 0.5 mg via INTRAVENOUS

## 2019-01-22 MED ORDER — LIDOCAINE 2% (20 MG/ML) 5 ML SYRINGE
INTRAMUSCULAR | Status: DC | PRN
  Administered 2019-01-22: 75 mg via INTRAVENOUS

## 2019-01-22 SURGICAL SUPPLY — 54 items
BAG ZIPLOCK 12X15 (MISCELLANEOUS) ×3 IMPLANT
BIT DRILL 2.0 (BIT) ×3 IMPLANT
COVER SURGICAL LIGHT HANDLE (MISCELLANEOUS) ×3 IMPLANT
COVER WAND RF STERILE (DRAPES) IMPLANT
DRAPE C-ARM 42X120 X-RAY (DRAPES) ×3 IMPLANT
DRAPE INCISE IOBAN 66X45 STRL (DRAPES) ×3 IMPLANT
DRAPE SURG 17X11 SM STRL (DRAPES) ×3 IMPLANT
DRAPE U-SHAPE 47X51 STRL (DRAPES) ×3 IMPLANT
DRILL 2.6X220MM LONG AO (BIT) ×3 IMPLANT
DRSG AQUACEL AG ADV 3.5X10 (GAUZE/BANDAGES/DRESSINGS) ×3 IMPLANT
DRSG EMULSION OIL 3X3 NADH (GAUZE/BANDAGES/DRESSINGS) ×3 IMPLANT
DURAPREP 26ML APPLICATOR (WOUND CARE) ×3 IMPLANT
ELECT REM PT RETURN 15FT ADLT (MISCELLANEOUS) ×3 IMPLANT
GAUZE SPONGE 4X4 12PLY STRL (GAUZE/BANDAGES/DRESSINGS) ×3 IMPLANT
GAUZE XEROFORM 1X8 LF (GAUZE/BANDAGES/DRESSINGS) ×3 IMPLANT
GLOVE BIO SURGEON STRL SZ7.5 (GLOVE) ×3 IMPLANT
GLOVE BIOGEL PI IND STRL 8 (GLOVE) ×2 IMPLANT
GLOVE BIOGEL PI INDICATOR 8 (GLOVE) ×4
GLOVE ECLIPSE 8.0 STRL XLNG CF (GLOVE) ×3 IMPLANT
GLOVE ORTHO TXT STRL SZ7.5 (GLOVE) ×3 IMPLANT
GOWN STRL REUS W/TWL LRG LVL3 (GOWN DISPOSABLE) ×3 IMPLANT
GOWN STRL REUS W/TWL XL LVL3 (GOWN DISPOSABLE) ×3 IMPLANT
K-WIRE 1.6X150 (WIRE) ×2
K-WIRE FX150X1.6XSMTH TROC (WIRE) ×1
KIT BASIN OR (CUSTOM PROCEDURE TRAY) ×3 IMPLANT
KIT TURNOVER KIT A (KITS) IMPLANT
KWIRE FX150X1.6XSMTH TROC (WIRE) ×1 IMPLANT
MANIFOLD NEPTUNE II (INSTRUMENTS) ×3 IMPLANT
NEEDLE HYPO 25X1 1.5 SAFETY (NEEDLE) IMPLANT
NS IRRIG 1000ML POUR BTL (IV SOLUTION) ×3 IMPLANT
PACK SHOULDER (CUSTOM PROCEDURE TRAY) ×3 IMPLANT
PLATE MIDSHAFT 8H SUPERIOR (Plate) ×3 IMPLANT
PROTECTOR NERVE ULNAR (MISCELLANEOUS) ×3 IMPLANT
PUTTY DBM STAGRAFT PLUS 5CC (Putty) ×3 IMPLANT
SCREW BONE 3.5X12 (Screw) ×3 IMPLANT
SCREW BONE 3.5X14MM (Screw) ×6 IMPLANT
SCREW BONE 3.5X16MM (Screw) ×6 IMPLANT
SCREW CORTICAL 2.7X12MM (Screw) ×3 IMPLANT
SCREW LOCKING 14MM (Screw) ×3 IMPLANT
SLING ARM FOAM STRAP LRG (SOFTGOODS) ×3 IMPLANT
SLING ARM IMMOBILIZER LRG (SOFTGOODS) ×3 IMPLANT
SPONGE LAP 18X18 RF (DISPOSABLE) IMPLANT
SUCTION FRAZIER HANDLE 10FR (MISCELLANEOUS) ×2
SUCTION TUBE FRAZIER 10FR DISP (MISCELLANEOUS) ×1 IMPLANT
SUT ETHILON 3 0 PS 1 (SUTURE) ×6 IMPLANT
SUT FIBERWIRE #2 38 T-5 BLUE (SUTURE) ×3
SUT PROLENE 3 0 PS 1 (SUTURE) IMPLANT
SUT VIC AB 0 CT1 36 (SUTURE) ×3 IMPLANT
SUT VIC AB 2-0 CT1 27 (SUTURE) ×2
SUT VIC AB 2-0 CT1 TAPERPNT 27 (SUTURE) ×1 IMPLANT
SUTURE FIBERWR #2 38 T-5 BLUE (SUTURE) ×1 IMPLANT
SYR CONTROL 10ML LL (SYRINGE) IMPLANT
TOWEL OR 17X26 10 PK STRL BLUE (TOWEL DISPOSABLE) ×6 IMPLANT
WATER STERILE IRR 1000ML POUR (IV SOLUTION) ×3 IMPLANT

## 2019-01-22 NOTE — Transfer of Care (Signed)
Immediate Anesthesia Transfer of Care Note  Patient: Micheal Mcgee  Procedure(s) Performed: OPEN REDUCTION INTERNAL FIXATION (ORIF) RIGHT CLAVICLE FRACTURE (Right Shoulder)  Patient Location: PACU  Anesthesia Type:General  Level of Consciousness: awake, drowsy and responds to stimulation  Airway & Oxygen Therapy: Patient Spontanous Breathing and Patient connected to face mask oxygen  Post-op Assessment: Report given to RN and Post -op Vital signs reviewed and stable  Post vital signs: Reviewed and stable  Last Vitals:  Vitals Value Taken Time  BP 132/70 01/22/19 1415  Temp 37.6 C 01/22/19 1415  Pulse 131 01/22/19 1417  Resp 17 01/22/19 1417  SpO2 100 % 01/22/19 1417  Vitals shown include unvalidated device data.  Last Pain:  Vitals:   01/22/19 0743  TempSrc: Oral         Complications: No apparent anesthesia complications
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
## 2019-01-22 NOTE — Anesthesia Postprocedure Evaluation (Signed)
Anesthesia Post Note  Patient: Micheal Mcgee  Procedure(s) Performed: OPEN REDUCTION INTERNAL FIXATION (ORIF) RIGHT CLAVICLE FRACTURE (Right Shoulder)     Patient location during evaluation: PACU Anesthesia Type: General Level of consciousness: awake and alert Pain management: pain level controlled Vital Signs Assessment: post-procedure vital signs reviewed and stable Respiratory status: spontaneous breathing, nonlabored ventilation, respiratory function stable and patient connected to nasal cannula oxygen Cardiovascular status: blood pressure returned to baseline and stable Postop Assessment: no apparent nausea or vomiting Anesthetic complications: no    Last Vitals:  Vitals:   01/22/19 1655 01/22/19 1822  BP: 134/89 (!) 135/95  Pulse: 99 (!) 110  Resp: 14 16  Temp: 37.6 C 36.7 C  SpO2: 98% 99%    Last Pain:  Vitals:   01/22/19 1928  TempSrc:   PainSc: 3                  Myana Schlup L Jacci Ruberg

## 2019-01-22 NOTE — Brief Op Note (Signed)
01/22/2019  1:47 PM  PATIENT:  Micheal Mcgee  38 y.o. male  PRE-OPERATIVE DIAGNOSIS:  right clavicle fracture  POST-OPERATIVE DIAGNOSIS:  right clavicle fracture  PROCEDURE:  Procedure(s): OPEN REDUCTION INTERNAL FIXATION (ORIF) RIGHT CLAVICLE FRACTURE (Right)  SURGEON:  Surgeon(s) and Role:    Mcarthur Rossetti, MD - Primary  PHYSICIAN ASSISTANT: Benita Stabile, PA-C  ANESTHESIA:   local and general  EBL:  15 mL   COUNTS:  YES  DICTATION: .Other Dictation: Dictation Number [id number]  PLAN OF CARE: Admit for overnight observation  PATIENT DISPOSITION:  PACU - hemodynamically stable.   Delay start of Pharmacological VTE agent (>24hrs) due to surgical blood loss or risk of bleeding: no
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
## 2019-01-22 NOTE — Anesthesia Procedure Notes (Signed)
Procedure Name: LMA Insertion Date/Time: 01/22/2019 11:08 AM Performed by: Lollie Sails, CRNA Pre-anesthesia Checklist: Patient identified, Emergency Drugs available, Suction available, Patient being monitored and Timeout performed Patient Re-evaluated:Patient Re-evaluated prior to induction Oxygen Delivery Method: Circle system utilized Preoxygenation: Pre-oxygenation with 100% oxygen Induction Type: IV induction Ventilation: Mask ventilation without difficulty LMA: LMA inserted LMA Size: 5.0 Number of attempts: 1 Placement Confirmation: positive ETCO2 and breath sounds checked- equal and bilateral Tube secured with: Tape Dental Injury: Teeth and Oropharynx as per pre-operative assessment

## 2019-01-22 NOTE — Anesthesia Preprocedure Evaluation (Addendum)
Anesthesia Evaluation  Patient identified by MRN, date of birth, ID band Patient awake    Reviewed: Allergy & Precautions, NPO status , Patient's Chart, lab work & pertinent test results  Airway Mallampati: II  TM Distance: >3 FB Neck ROM: Full    Dental no notable dental hx. (+) Teeth Intact, Dental Advisory Given   Pulmonary neg pulmonary ROS, former smoker,    Pulmonary exam normal breath sounds clear to auscultation       Cardiovascular negative cardio ROS Normal cardiovascular exam Rhythm:Regular Rate:Normal     Neuro/Psych PSYCHIATRIC DISORDERS Anxiety Depression negative neurological ROS     GI/Hepatic negative GI ROS, Neg liver ROS,   Endo/Other  negative endocrine ROS  Renal/GU negative Renal ROS  negative genitourinary   Musculoskeletal negative musculoskeletal ROS (+)   Abdominal   Peds  Hematology negative hematology ROS (+)   Anesthesia Other Findings   Reproductive/Obstetrics                            Anesthesia Physical Anesthesia Plan  ASA: II  Anesthesia Plan: General   Post-op Pain Management:    Induction: Intravenous  PONV Risk Score and Plan: 2 and Midazolam, Dexamethasone and Ondansetron  Airway Management Planned: Oral ETT and LMA  Additional Equipment:   Intra-op Plan:   Post-operative Plan: Extubation in OR  Informed Consent: I have reviewed the patients History and Physical, chart, labs and discussed the procedure including the risks, benefits and alternatives for the proposed anesthesia with the patient or authorized representative who has indicated his/her understanding and acceptance.     Dental advisory given  Plan Discussed with: CRNA  Anesthesia Plan Comments:         Anesthesia Quick Evaluation

## 2019-01-23 DIAGNOSIS — F419 Anxiety disorder, unspecified: Secondary | ICD-10-CM | POA: Diagnosis not present

## 2019-01-23 DIAGNOSIS — Z87891 Personal history of nicotine dependence: Secondary | ICD-10-CM | POA: Diagnosis not present

## 2019-01-23 DIAGNOSIS — S42021A Displaced fracture of shaft of right clavicle, initial encounter for closed fracture: Secondary | ICD-10-CM | POA: Diagnosis not present

## 2019-01-23 DIAGNOSIS — F329 Major depressive disorder, single episode, unspecified: Secondary | ICD-10-CM | POA: Diagnosis not present

## 2019-01-23 DIAGNOSIS — Z79899 Other long term (current) drug therapy: Secondary | ICD-10-CM | POA: Diagnosis not present

## 2019-01-23 MED ORDER — OXYCODONE HCL 5 MG PO TABS: 5.0000 mg | ORAL_TABLET | ORAL | 0 refills | Status: DC | PRN

## 2019-01-23 MED ORDER — METHOCARBAMOL 500 MG PO TABS: 500.0000 mg | ORAL_TABLET | Freq: Four times a day (QID) | ORAL | 0 refills | Status: DC | PRN

## 2019-01-23 NOTE — Discharge Instructions (Signed)
Increase your activities as comfort allows. No lifting anything greater than 5 lbs with your right arm. Do not reach overhead with your right arm. Expect swelling - ice as needed. Come out of your sling periodically as comfort allows. You can get your current dressing wet daily in the shower. You can change that dressing with a new dressing in 5-6 days if needed.

## 2019-01-23 NOTE — Progress Notes (Signed)
Nurse reviewed discharge instructions with pt and his mother. Pt and mother verbalized understanding of discharge instructions, follow up appointment and new medications.  No concerns at time of discharge.

## 2019-01-23 NOTE — Progress Notes (Signed)
Subjective: 1 Day Post-Op Procedure(s) (LRB): OPEN REDUCTION INTERNAL FIXATION (ORIF) RIGHT CLAVICLE FRACTURE (Right) Patient reports pain as moderate.    Objective: Vital signs in last 24 hours: Temp:  [98.1 F (36.7 C)-99.6 F (37.6 C)] 98.7 F (37.1 C) (09/12 0510) Pulse Rate:  [94-123] 99 (09/12 0510) Resp:  [7-16] 16 (09/12 0510) BP: (132-151)/(70-102) 147/102 (09/12 0510) SpO2:  [96 %-100 %] 99 % (09/12 0510)  Intake/Output from previous day: 09/11 0701 - 09/12 0700 In: 2307.2 [P.O.:180; I.V.:1977.2; IV Piggyback:150] Out: 1930 [Urine:1880; Blood:50] Intake/Output this shift: Total I/O In: 120 [P.O.:120] Out: -   Recent Labs    01/21/19 1500  HGB 15.6   Recent Labs    01/21/19 1500  WBC 7.2  RBC 4.86  HCT 46.0  PLT 210   Recent Labs    01/21/19 1500  NA 139  K 4.1  CL 102  CO2 26  BUN 11  CREATININE 0.86  GLUCOSE 134*  CALCIUM 9.0   No results for input(s): LABPT, INR in the last 72 hours.  Neurovascular intact Sensation intact distally Intact pulses distally Incision: scant drainage   Assessment/Plan: 1 Day Post-Op Procedure(s) (LRB): OPEN REDUCTION INTERNAL FIXATION (ORIF) RIGHT CLAVICLE FRACTURE (Right) Discharge to home today      Mcarthur Rossetti 01/23/2019, 9:56 AM

## 2019-01-23 NOTE — Discharge Summary (Signed)
Patient ID: Micheal Mcgee MRN: [id number] DOB/AGE: December 13, 1980 38 y.o.  Admit date: 01/22/2019 Discharge date: 01/23/2019  Admission Diagnoses:  Principal Problem:   Closed displaced fracture of shaft of right clavicle Active Problems:   Right clavicle fracture   Discharge Diagnoses:  Same  Past Medical History:  Diagnosis Date  . Anxiety   . Depression     Surgeries: Procedure(s): OPEN REDUCTION INTERNAL FIXATION (ORIF) RIGHT CLAVICLE FRACTURE on 01/22/2019   Consultants:   Discharged Condition: Improved  Hospital Course: Micheal Mcgee is an 38 y.o. male who was admitted 01/22/2019 for operative treatment ofClosed displaced fracture of shaft of right clavicle. Patient has severe unremitting pain that affects sleep, daily activities, and work/hobbies. After pre-op clearance the patient was taken to the operating room on 01/22/2019 and underwent  Procedure(s): OPEN REDUCTION INTERNAL FIXATION (ORIF) RIGHT CLAVICLE FRACTURE.    Patient was given perioperative antibiotics:  Anti-infectives (From admission, onward)   Start     Dose/Rate Route Frequency Ordered Stop   01/22/19 1700  ceFAZolin (ANCEF) IVPB 1 g/50 mL premix     1 g 100 mL/hr over 30 Minutes Intravenous Every 6 hours 01/22/19 1659 01/23/19 0618   01/22/19 0715  ceFAZolin (ANCEF) IVPB 2g/100 mL premix     2 g 200 mL/hr over 30 Minutes Intravenous On call to O.R. 01/22/19 [id number] 01/22/19 1125       Patient was given sequential compression devices, early ambulation, and chemoprophylaxis to prevent DVT.  Patient benefited maximally from hospital stay and there were no complications.    Recent vital signs:  Patient Vitals for the past 24 hrs:  BP Temp Temp src Pulse Resp SpO2  01/23/19 0510 (!) 147/102 98.7 F (37.1 C) Oral 99 16 99 %  01/23/19 0105 (!) 148/93 99.5 F (37.5 C) Oral 94 16 98 %  01/22/19 2107 (!) 142/90 98.9 F (37.2 C) Oral 96 16 98 %  01/22/19 1937 (!) 151/96 99 F (37.2 C) Oral (!) 114 16 99  %  01/22/19 1822 (!) 135/95 98.1 F (36.7 C) Oral (!) 110 16 99 %  01/22/19 1655 134/89 99.6 F (37.6 C) Oral 99 14 98 %  01/22/19 1630 (!) 145/94 98.4 F (36.9 C) - (!) 105 13 100 %  01/22/19 1615 (!) 139/91 - - (!) 104 (!) 7 100 %  01/22/19 1600 (!) 137/92 - - (!) 109 (!) 8 99 %  01/22/19 1545 (!) 141/83 - - (!) 123 12 96 %  01/22/19 1540 137/88 - - (!) 118 - 98 %  01/22/19 1415 132/70 99.6 F (37.6 C) - 97 11 100 %     Recent laboratory studies:  Recent Labs    01/21/19 1500  WBC 7.2  HGB 15.6  HCT 46.0  PLT 210  NA 139  K 4.1  CL 102  CO2 26  BUN 11  CREATININE 0.86  GLUCOSE 134*  CALCIUM 9.0     Discharge Medications:   Allergies as of 01/23/2019   No Known Allergies     Medication List    STOP taking these medications   HYDROcodone-acetaminophen 5-325 MG tablet Commonly known as: NORCO/VICODIN     TAKE these medications   FLUoxetine 10 MG capsule Commonly known as: PROZAC Take 1 capsule (10 mg total) by mouth daily.   methocarbamol 500 MG tablet Commonly known as: ROBAXIN Take 1 tablet (500 mg total) by mouth every 6 (six) hours as needed for muscle spasms.   modafinil 100 MG  tablet Commonly known as: PROVIGIL Take 1 tablet (100 mg total) by mouth daily.   oxyCODONE 5 MG immediate release tablet Commonly known as: Oxy IR/ROXICODONE Take 1-2 tablets (5-10 mg total) by mouth every 4 (four) hours as needed for moderate pain (pain score 4-6).       Diagnostic Studies: Dg Clavicle Right  Result Date: 01/22/2019 CLINICAL DATA:  Right clavicle fracture. EXAM: RIGHT CLAVICLE - 2+ VIEWS; DG C-ARM 1-60 MIN-NO REPORT COMPARISON:  Radiographs dated 01/19/2019 FINDINGS: Multiple C-arm images demonstrate the patient undergoing open reduction and internal fixation of the comminuted fracture of the right clavicular shaft. A plate and 6 screws have been inserted. Alignment and position is markedly improved. IMPRESSION: Open reduction and internal fixation of  right clavicle fracture. FLUOROSCOPY TIME:  13 seconds C-arm fluoroscopic images were obtained intraoperatively and submitted for post operative interpretation. Electronically Signed   By: Lorriane Shire M.D.   On: 01/22/2019 15:44   Dg Clavicle Right  Result Date: 01/19/2019 CLINICAL DATA:  Fall pain EXAM: RIGHT CLAVICLE - 2+ VIEWS; RIGHT SHOULDER - 2+ VIEW COMPARISON:  None. FINDINGS: There is a comminuted mid clavicular shaft fracture with overlap of fracture fragments and 1 shaft with inferior displacement of the distal clavicle. There are several fracture fragments seen adjacent to the fracture site. No AC joint separation is seen. The glenohumeral joint appears to be maintained. IMPRESSION: Comminuted midclavicular fracture with overlap of fracture fragments and inferior displacement of the distal clavicle. Electronically Signed   By: Prudencio Pair M.D.   On: 01/19/2019 02:24   Dg Shoulder Right  Result Date: 01/19/2019 CLINICAL DATA:  Fall pain EXAM: RIGHT CLAVICLE - 2+ VIEWS; RIGHT SHOULDER - 2+ VIEW COMPARISON:  None. FINDINGS: There is a comminuted mid clavicular shaft fracture with overlap of fracture fragments and 1 shaft with inferior displacement of the distal clavicle. There are several fracture fragments seen adjacent to the fracture site. No AC joint separation is seen. The glenohumeral joint appears to be maintained. IMPRESSION: Comminuted midclavicular fracture with overlap of fracture fragments and inferior displacement of the distal clavicle. Electronically Signed   By: Prudencio Pair M.D.   On: 01/19/2019 02:24   Dg C-arm 1-60 Min-no Report  Result Date: 01/22/2019 Fluoroscopy was utilized by the requesting physician.  No radiographic interpretation.    Disposition: Discharge disposition: 01-Home or Self Care         Follow-up Information    Mcarthur Rossetti, MD. Schedule an appointment as soon as possible for a visit in 2 week(s).   Specialty: Orthopedic  Surgery Contact information: [place] [address] [phone]            Signed: Mcarthur Rossetti 01/23/2019, 10:00 AM

## 2019-01-26 ENCOUNTER — Encounter (HOSPITAL_COMMUNITY): Payer: Self-pay | Admitting: Orthopaedic Surgery

## 2019-02-04 ENCOUNTER — Other Ambulatory Visit: Payer: Self-pay

## 2019-02-04 ENCOUNTER — Ambulatory Visit: Payer: Medicare Other

## 2019-02-04 ENCOUNTER — Encounter: Payer: Self-pay | Admitting: Orthopaedic Surgery

## 2019-02-04 ENCOUNTER — Ambulatory Visit (INDEPENDENT_AMBULATORY_CARE_PROVIDER_SITE_OTHER): Payer: Medicare Other | Admitting: Orthopaedic Surgery

## 2019-02-04 DIAGNOSIS — S42021D Displaced fracture of shaft of right clavicle, subsequent encounter for fracture with routine healing: Secondary | ICD-10-CM

## 2019-02-04 DIAGNOSIS — Z9889 Other specified postprocedural states: Secondary | ICD-10-CM | POA: Diagnosis not present

## 2019-02-04 DIAGNOSIS — Z8781 Personal history of (healed) traumatic fracture: Secondary | ICD-10-CM | POA: Diagnosis not present

## 2019-02-04 MED ORDER — OXYCODONE HCL 5 MG PO TABS: 5.0000 mg | ORAL_TABLET | Freq: Four times a day (QID) | ORAL | 0 refills | Status: DC | PRN

## 2019-02-04 MED ORDER — METHOCARBAMOL 500 MG PO TABS: 500.0000 mg | ORAL_TABLET | Freq: Four times a day (QID) | ORAL | 0 refills | Status: AC | PRN

## 2019-02-04 NOTE — Progress Notes (Signed)
The patient is 2 weeks status post open reduction internal fixation of a comminuted complex right midshaft clavicle fracture.  He is doing well postoperatively.  He said he is a little bit sore but he does tolerate moving his arm around better and he likes that he cannot feel the pieces move.  On examination of his right shoulder and clavicle area the incision looks good to remove sutures in place Steri-Strips.  2 views of the right clavicle are obtained and show that the plate is intact and the alignment is overall well-maintained.  Bone graft material can be seen around the fracture site.  He knows to avoid overhead activities and any strenuous activity with his right shoulder.  He understands that it was a very difficult fracture to repair and I want him to go slow with this shoulder.  I will refill his pain medication and muscle relaxer.  We will see him back in 4 weeks with a repeat 2 views of the right clavicle.

## 2019-03-04 ENCOUNTER — Ambulatory Visit (INDEPENDENT_AMBULATORY_CARE_PROVIDER_SITE_OTHER): Payer: Medicare Other

## 2019-03-04 ENCOUNTER — Encounter: Payer: Self-pay | Admitting: Orthopaedic Surgery

## 2019-03-04 ENCOUNTER — Other Ambulatory Visit: Payer: Self-pay

## 2019-03-04 ENCOUNTER — Ambulatory Visit (INDEPENDENT_AMBULATORY_CARE_PROVIDER_SITE_OTHER): Payer: Medicare Other | Admitting: Orthopaedic Surgery

## 2019-03-04 DIAGNOSIS — S42021D Displaced fracture of shaft of right clavicle, subsequent encounter for fracture with routine healing: Secondary | ICD-10-CM

## 2019-03-04 DIAGNOSIS — Z9889 Other specified postprocedural states: Secondary | ICD-10-CM

## 2019-03-04 DIAGNOSIS — Z8781 Personal history of (healed) traumatic fracture: Secondary | ICD-10-CM

## 2019-03-04 MED ORDER — HYDROCODONE-ACETAMINOPHEN 5-325 MG PO TABS: 1.0000 | ORAL_TABLET | Freq: Four times a day (QID) | ORAL | 0 refills | Status: DC | PRN

## 2019-03-04 NOTE — Progress Notes (Signed)
The patient is now 5 weeks status post open reduction-in her fixation of a right clavicle shaft fracture.  He says he is doing well overall.  He is still having some pain but is slowly moving his arm more.  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  He says is nothing greater than 10 to 15 pounds.  He is ready to go back to work in another week.  On exam his clavicle incision looks like it is healed nicely.  There is no prominence of the hardware itself in terms of any complicating features but his clavicles are very prominent General: He is a very thin individual.  He has normal right shoulder function and his hand is well-perfused and neurovascularly intact.  2 views of the right clavicle show that the hardware is intact and in place with no complicating features.  There is been some interval healing.  He will continue increase his activities as comfort allows but no contact sports and no push-ups.  I would like to see him back in 4 weeks for the final hopeful 2 views of his right clavicle.  All question concerns were answered and addressed.  I will send in some hydrocodone for pain.

## 2019-03-17 ENCOUNTER — Telehealth: Payer: Self-pay

## 2019-03-17 NOTE — Telephone Encounter (Signed)
FYI: Patient called and stated that he is ready to return to work and would like for Dr [employer] assistant to fill out RTW form so he can go back to work. Patient sated he will try and get the form tomorrow and bring it in.

## 2019-03-24 ENCOUNTER — Telehealth: Payer: Self-pay | Admitting: Orthopaedic Surgery

## 2019-03-24 NOTE — Telephone Encounter (Signed)
Patient called wanted to know when return back to work note will be ready to pick up?  Please call patient @ [phone]

## 2019-03-25 ENCOUNTER — Telehealth: Payer: Self-pay | Admitting: Orthopaedic Surgery

## 2019-03-25 NOTE — Telephone Encounter (Signed)
His forms are ready at the front desk

## 2019-03-25 NOTE — Telephone Encounter (Signed)
Patient called. Says he will be by tomorrow to pick form up.

## 2019-03-25 NOTE — Telephone Encounter (Signed)
I have called patient multiple times, no answer and his voicemail isn't set up where I can leave him a voicemail

## 2019-04-01 ENCOUNTER — Ambulatory Visit (INDEPENDENT_AMBULATORY_CARE_PROVIDER_SITE_OTHER): Payer: Medicare Other | Admitting: Orthopaedic Surgery

## 2019-04-01 ENCOUNTER — Ambulatory Visit (INDEPENDENT_AMBULATORY_CARE_PROVIDER_SITE_OTHER): Payer: Medicare Other

## 2019-04-01 ENCOUNTER — Encounter: Payer: Self-pay | Admitting: Orthopaedic Surgery

## 2019-04-01 ENCOUNTER — Other Ambulatory Visit: Payer: Self-pay

## 2019-04-01 DIAGNOSIS — S42021D Displaced fracture of shaft of right clavicle, subsequent encounter for fracture with routine healing: Secondary | ICD-10-CM | POA: Diagnosis not present

## 2019-04-01 NOTE — Progress Notes (Signed)
The patient is now 10 weeks status post open reduction/internal fixation of a right comminuted midshaft clavicle fracture.  He states that he is doing well overall.  He still having some pain to be expected.  Exam is a very thin individual so his clavicles on both sides are quite prominent.  His incision is healed.  On the right shoulder which is the side that he has a clavicle fracture he lacks full abduction by about 10 degrees.  Is certainly stiff with the shoulder itself.  2 views of the right clavicle are obtained and show that the fracture is healing.  Is not healed completely yet.  I will allow him to return to full work duties without restrictions starting November 30 as he is healing this injury.  I still would like to see him back in 6 weeks for final 2 views of his right clavicle.  All question concerns were answered and addressed.

## 2019-05-13 ENCOUNTER — Ambulatory Visit (INDEPENDENT_AMBULATORY_CARE_PROVIDER_SITE_OTHER): Payer: Medicare Other | Admitting: Orthopaedic Surgery

## 2019-05-13 ENCOUNTER — Encounter: Payer: Self-pay | Admitting: Orthopaedic Surgery

## 2019-05-13 ENCOUNTER — Ambulatory Visit (INDEPENDENT_AMBULATORY_CARE_PROVIDER_SITE_OTHER): Payer: Medicare Other

## 2019-05-13 ENCOUNTER — Other Ambulatory Visit: Payer: Self-pay | Admitting: Radiology

## 2019-05-13 ENCOUNTER — Other Ambulatory Visit: Payer: Self-pay

## 2019-05-13 DIAGNOSIS — Z9889 Other specified postprocedural states: Secondary | ICD-10-CM | POA: Diagnosis not present

## 2019-05-13 DIAGNOSIS — S42021D Displaced fracture of shaft of right clavicle, subsequent encounter for fracture with routine healing: Secondary | ICD-10-CM

## 2019-05-13 DIAGNOSIS — Z8781 Personal history of (healed) traumatic fracture: Secondary | ICD-10-CM

## 2019-05-13 NOTE — Progress Notes (Signed)
HPI: Micheal Mcgee is an 38 year old male who returns today 3-1/2 months status post open reduction internal fixation right clavicle fracture.  He states he is overall doing well.  He states he is gaining range of motion and strength the arm.  He is taking occasional Tylenol for pain.  Review of systems: No fevers chills shortness of breath chest pain  Physical exam: General well-developed well-nourished male no acute distress mood affect appropriate  Right clavicle nontender.  Palpable callus over the fracture site.  Surgical incisions well-healed no signs of infection.  Has limited forward flexion coming to about 110 to 120 degrees actively.  Also limited internal rotation right shoulder.  Full range of motion the right elbow forearm wrist and hand.  Radiographs: Right clavicle 2 views: No hardware failure.  Fracture remains in good position alignment.  Slight further consolidation but fracture remains evident compared to prior films in November.  Impression: 3-1/2 months status post open reduction internal fixation right clavicle fracture  Plan: We will send him for physical therapy to work on range of motion strengthening shoulder.  No contact sports.  Would like to see him back in 2 months to check his range of motion also there is repeat radiographs to check for further consolidation at the fracture site.  Questions encouraged and answered at length.

## 2019-05-25 ENCOUNTER — Ambulatory Visit (HOSPITAL_COMMUNITY): Payer: Medicare Other | Attending: Orthopaedic Surgery | Admitting: Physical Therapy

## 2019-05-25 ENCOUNTER — Encounter (HOSPITAL_COMMUNITY): Payer: Self-pay | Admitting: Physical Therapy

## 2019-05-25 ENCOUNTER — Other Ambulatory Visit: Payer: Self-pay

## 2019-05-25 DIAGNOSIS — M6281 Muscle weakness (generalized): Secondary | ICD-10-CM | POA: Diagnosis not present

## 2019-05-25 DIAGNOSIS — M25611 Stiffness of right shoulder, not elsewhere classified: Secondary | ICD-10-CM | POA: Diagnosis not present
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
## 2019-05-27 ENCOUNTER — Encounter (HOSPITAL_COMMUNITY): Payer: Self-pay | Admitting: Physical Therapy

## 2019-05-27 ENCOUNTER — Ambulatory Visit (HOSPITAL_COMMUNITY): Payer: Medicare Other | Admitting: Physical Therapy

## 2019-05-27 ENCOUNTER — Other Ambulatory Visit: Payer: Self-pay

## 2019-05-27 DIAGNOSIS — M25611 Stiffness of right shoulder, not elsewhere classified: Secondary | ICD-10-CM

## 2019-05-27 DIAGNOSIS — M6281 Muscle weakness (generalized): Secondary | ICD-10-CM

## 2019-05-27 NOTE — Patient Instructions (Signed)
Cane Exercise: Flexion    Lie on back, holding cane above chest. Keeping arms as straight as possible, lower cane toward floor beyond head. Hold _10___ seconds. Repeat _10___ times. Do _1-2___ sessions per day.  http://gt2.exer.us/91   Copyright  VHI. All rights reserved.  Cane Exercise: Abduction / Adduction    Hold cane palms down. Keeping back flat, move cane side to side over chest. Hold __10__ seconds each side. Repeat _10___ times. Do _1-2___ sessions per day.  http://gt2.exer.us/89   Copyright  VHI. All rights reserved.

## 2019-05-27 NOTE — Therapy (Signed)
[place], [place], [address] Phone: [phone]   Fax:  [phone]  Physical Therapy Treatment  Patient Details  Name: Micheal Mcgee MRN: [id number] Date of Birth: Jun 12, 1980 Referring Provider (PT): Jean Rosenthal, MD   Encounter Date: 05/27/2019  PT End of Session - 05/27/19 1628    Visit Number  2    Number of Visits  8    Date for PT Re-Evaluation  06/22/19    Authorization Type  Primary: Medicare; Secondary: Medicaid    Authorization Time Period  05/25/19 - 06/22/19    Authorization - Visit Number  2    Authorization - Number of Visits  10    PT Start Time  1600    PT Stop Time  1638    PT Time Calculation (min)  38 min    Activity Tolerance  Patient tolerated treatment well    Behavior During Therapy  [facility] for tasks assessed/performed       Past Medical History:  Diagnosis Date  . Anxiety   . Depression     Past Surgical History:  Procedure Laterality Date  . MOLE REMOVAL    . ORIF CLAVICULAR FRACTURE Right 01/22/2019   Procedure: OPEN REDUCTION INTERNAL FIXATION (ORIF) RIGHT CLAVICLE FRACTURE;  Surgeon: Mcarthur Rossetti, MD;  Location: [facility];  Service: Orthopedics;  Laterality: Right;    There were no vitals filed for this visit.  Subjective Assessment - 05/27/19 1618    Subjective  Patient reported having some shoulder pain today, but it being okay.    Pertinent History  S/P RT clavicle ORIF 01/22/19    Limitations  Lifting;House hold activities    Diagnostic tests  X-ray    Patient Stated Goals  Move his arm better    Currently in Pain?  Yes    Pain Score  5     Pain Location  Shoulder    Pain Orientation  Right    Pain Descriptors / Indicators  Dull;Aching    Pain Type  Surgical pain    Pain Onset  More than a month ago    Pain Frequency  Intermittent                       [facility] Adult PT Treatment/Exercise - 05/27/19 0001      Exercises   Exercises   Shoulder      Shoulder Exercises: Standing   Retraction  Strengthening;Both;15 reps      Shoulder Exercises: ROM/Strengthening   Other ROM/Strengthening Exercises  Towel stretch for IR and ER 3x20'' each. Wall walk to number 12 with some compensation noted x10.     Other ROM/Strengthening Exercises  Stool scoot for GHJ flexion 10x10'', supine AAROM GHJ flexion and Abduction with pipe 10x10''.                PT Short Term Goals - 05/27/19 1626      PT SHORT TERM GOAL #1   Title  Patient will report understanding and regular compliance with HEP to improve ROM, strength, and functional mobility.    Time  2    Period  Weeks    Status  New    Target Date  06/08/19        PT Long Term Goals - 05/27/19 1629      PT LONG TERM GOAL #1   Title  Patient will demonstrate RT shoulder AROM WFL to perform household activities without  difficulty.    Time  4    Period  Weeks    Status  On-going      PT LONG TERM GOAL #2   Title  Patient will demonstrate 5/5 strength in RT shoulder without reports of pain to return to work duties without difficulty.    Time  4    Period  Weeks    Status  On-going      PT LONG TERM GOAL #3   Title  Patient will demonstrate improvement of at least 10% on FOTO indicating improved percieved functional mobility.    Time  4    Period  Weeks    Status  On-going            Plan - 05/27/19 1647    Clinical Impression Statement  Focused on ROM exercises this session. Added AAROM with cane to patient's HEP. Patient demonstrated compensations with exercises from trunk particularly requiring cueing to correct. Patient would benefit from continued skilled physical therapy to continue progressing towards functional goals.    Personal Factors and Comorbidities  Comorbidity 2    Comorbidities  Anxiety, depression    Examination-Activity Limitations  Reach Overhead;Carry    Examination-Participation Restrictions  Yard Work;Cleaning;Meal Prep;Community  Activity    Stability/Clinical Decision Making  Stable/Uncomplicated    Rehab Potential  Good    PT Frequency  2x / week    PT Duration  4 weeks    PT Treatment/Interventions  ADLs/Self Care Home Management;Cryotherapy;Electrical Stimulation;Iontophoresis 4mg /ml Dexamethasone;Moist Heat;Ultrasound;Functional mobility training;Therapeutic activities;Therapeutic exercise;Balance training;Neuromuscular re-education;Patient/family education;Manual techniques;Scar mobilization;Passive range of motion;Dry needling;Energy conservation;Taping    PT Next Visit Plan  ROM all directions. STM, gentle joint mobilizations    PT Home Exercise Plan  05/25/19: Gentle flexion/abduction 15x; 05/27/19: AAROM flexion and abduction x10 each 1-2x/day    Consulted and Agree with Plan of Care  Patient       Patient will benefit from skilled therapeutic intervention in order to improve the following deficits and impairments:  Increased fascial restricitons, Improper body mechanics, Pain, Decreased mobility, Decreased scar mobility, Postural dysfunction, Decreased activity tolerance, Decreased endurance, Decreased range of motion, Decreased strength, Hypomobility, Impaired UE functional use  Visit Diagnosis: Stiffness of right shoulder, not elsewhere classified  Muscle weakness (generalized)     Problem List Patient Active Problem List   Diagnosis Date Noted  . Right clavicle fracture 01/22/2019  . Closed displaced fracture of shaft of right clavicle 01/20/2019   Clarene Critchley PT, DPT 4:48 PM, 05/27/19 [place], [place], [address] Phone: [phone]   Fax:  [phone]  Name: LEIBISH PAYNTER MRN: [id number] Date of Birth: 01-22-1981

## 2019-06-01 ENCOUNTER — Encounter (HOSPITAL_COMMUNITY): Payer: Self-pay | Admitting: Physical Therapy

## 2019-06-01 ENCOUNTER — Ambulatory Visit (HOSPITAL_COMMUNITY): Payer: Medicare Other | Admitting: Physical Therapy

## 2019-06-01 ENCOUNTER — Other Ambulatory Visit: Payer: Self-pay

## 2019-06-01 DIAGNOSIS — M6281 Muscle weakness (generalized): Secondary | ICD-10-CM | POA: Diagnosis not present

## 2019-06-01 DIAGNOSIS — M25611 Stiffness of right shoulder, not elsewhere classified: Secondary | ICD-10-CM | POA: Diagnosis not present

## 2019-06-01 NOTE — Therapy (Signed)
[place], [place], [address] Phone: [phone]   Fax:  [phone]  Physical Therapy Treatment  Patient Details  Name: Micheal Mcgee MRN: [id number] Date of Birth: December 14, 1980 Referring Provider (PT): Jean Rosenthal, MD   Encounter Date: 06/01/2019  PT End of Session - 06/01/19 1155    Visit Number  3    Number of Visits  8    Date for PT Re-Evaluation  06/22/19    Authorization Type  Primary: Medicare; Secondary: Medicaid    Authorization Time Period  05/25/19 - 06/22/19    Authorization - Visit Number  3    Authorization - Number of Visits  10    PT Start Time  [id number]    PT Stop Time  1202    PT Time Calculation (min)  41 min    Activity Tolerance  Patient tolerated treatment well    Behavior During Therapy  [facility] for tasks assessed/performed       Past Medical History:  Diagnosis Date  . Anxiety   . Depression     Past Surgical History:  Procedure Laterality Date  . MOLE REMOVAL    . ORIF CLAVICULAR FRACTURE Right 01/22/2019   Procedure: OPEN REDUCTION INTERNAL FIXATION (ORIF) RIGHT CLAVICLE FRACTURE;  Surgeon: Mcarthur Rossetti, MD;  Location: [facility];  Service: Orthopedics;  Laterality: Right;    There were no vitals filed for this visit.  Subjective Assessment - 06/01/19 1124    Subjective  Patient reported feeling good with only minimal discomfort, denied any pain.    Pertinent History  S/P RT clavicle ORIF 01/22/19    Limitations  Lifting;House hold activities    Diagnostic tests  X-ray    Patient Stated Goals  Move his arm better    Currently in Pain?  No/denies                       [facility] Adult PT Treatment/Exercise - 06/01/19 0001      Shoulder Exercises: ROM/Strengthening   Other ROM/Strengthening Exercises  Towel stretch for IR and ER 3x20'' each. Wall walk to number 13 with some compensation noted x10.     Other ROM/Strengthening Exercises  Stool scoot for GHJ  flexion and then abduction 10x10'', supine AAROM GHJ flexion and Abduction with pipe 10x10''.       Shoulder Exercises: Stretch   Other Shoulder Stretches  Posterior capsule stretch 3x20''       Manual Therapy   Manual Therapy  Joint mobilization    Manual therapy comments  All manual completed separately from other skilled interventions    Joint Mobilization  Inferior GHJ grade III joint mobilizations 20x to improve mobility; extremely tight               PT Short Term Goals - 05/27/19 1626      PT SHORT TERM GOAL #1   Title  Patient will report understanding and regular compliance with HEP to improve ROM, strength, and functional mobility.    Time  2    Period  Weeks    Status  New    Target Date  06/08/19        PT Long Term Goals - 05/27/19 1629      PT LONG TERM GOAL #1   Title  Patient will demonstrate RT shoulder AROM WFL to perform household activities without difficulty.    Time  4    Period  Weeks  Status  On-going      PT LONG TERM GOAL #2   Title  Patient will demonstrate 5/5 strength in RT shoulder without reports of pain to return to work duties without difficulty.    Time  4    Period  Weeks    Status  On-going      PT LONG TERM GOAL #3   Title  Patient will demonstrate improvement of at least 10% on FOTO indicating improved percieved functional mobility.    Time  4    Period  Weeks    Status  On-going            Plan - 06/01/19 1300    Clinical Impression Statement  Continued with a focus on improving patient's AROM this session. Patient reporting feeling improvement in ability to move shoulder some. This session added posterior capsule stretching as well as gentle joint mobilizations to improve overall mobility. Noted significant tightness with passive movement into abduction and joint mobility. Plan to continue joint mobilization.    Personal Factors and Comorbidities  Comorbidity 2    Comorbidities  Anxiety, depression     Examination-Activity Limitations  Reach Overhead;Carry    Examination-Participation Restrictions  Yard Work;Cleaning;Meal Prep;Community Activity    Stability/Clinical Decision Making  Stable/Uncomplicated    Rehab Potential  Good    PT Frequency  2x / week    PT Duration  4 weeks    PT Treatment/Interventions  ADLs/Self Care Home Management;Cryotherapy;Electrical Stimulation;Iontophoresis 4mg /ml Dexamethasone;Moist Heat;Ultrasound;Functional mobility training;Therapeutic activities;Therapeutic exercise;Balance training;Neuromuscular re-education;Patient/family education;Manual techniques;Scar mobilization;Passive range of motion;Dry needling;Energy conservation;Taping    PT Next Visit Plan  ROM all directions. STM, gentle joint mobilizations    PT Home Exercise Plan  05/25/19: Gentle flexion/abduction 15x; 05/27/19: AAROM flexion and abduction x10 each 1-2x/day    Consulted and Agree with Plan of Care  Patient       Patient will benefit from skilled therapeutic intervention in order to improve the following deficits and impairments:  Increased fascial restricitons, Improper body mechanics, Pain, Decreased mobility, Decreased scar mobility, Postural dysfunction, Decreased activity tolerance, Decreased endurance, Decreased range of motion, Decreased strength, Hypomobility, Impaired UE functional use  Visit Diagnosis: Stiffness of right shoulder, not elsewhere classified  Muscle weakness (generalized)     Problem List Patient Active Problem List   Diagnosis Date Noted  . Right clavicle fracture 01/22/2019  . Closed displaced fracture of shaft of right clavicle 01/20/2019   Clarene Critchley PT, DPT 1:02 PM, 06/01/19 [place], [place], [address] Phone: [phone]   Fax:  [phone]  Name: Micheal Mcgee MRN: [id number] Date of Birth: 07-09-1980

## 2019-06-03 ENCOUNTER — Ambulatory Visit (HOSPITAL_COMMUNITY): Payer: Medicare Other | Admitting: Physical Therapy

## 2019-06-03 ENCOUNTER — Other Ambulatory Visit: Payer: Self-pay

## 2019-06-03 ENCOUNTER — Encounter (HOSPITAL_COMMUNITY): Payer: Self-pay | Admitting: Physical Therapy

## 2019-06-03 DIAGNOSIS — M25611 Stiffness of right shoulder, not elsewhere classified: Secondary | ICD-10-CM | POA: Diagnosis not present

## 2019-06-03 DIAGNOSIS — M6281 Muscle weakness (generalized): Secondary | ICD-10-CM | POA: Diagnosis not present

## 2019-06-03 NOTE — Therapy (Signed)
[place], [place], [address] Phone: [phone]   Fax:  [phone]  Physical Therapy Treatment  Patient Details  Name: Micheal Mcgee MRN: [id number] Date of Birth: Aug 28, 1980 Referring Provider (PT): Jean Rosenthal, MD   Encounter Date: 06/03/2019  PT End of Session - 06/03/19 1356    Visit Number  4    Number of Visits  8    Date for PT Re-Evaluation  06/22/19    Authorization Type  Primary: Medicare; Secondary: Medicaid    Authorization Time Period  05/25/19 - 06/22/19    Authorization - Visit Number  4    Authorization - Number of Visits  10    PT Start Time  [id number]   Patient arrived late   PT Stop Time  1430    PT Time Calculation (min)  38 min    Activity Tolerance  Patient tolerated treatment well    Behavior During Therapy  [facility] for tasks assessed/performed       Past Medical History:  Diagnosis Date  . Anxiety   . Depression     Past Surgical History:  Procedure Laterality Date  . MOLE REMOVAL    . ORIF CLAVICULAR FRACTURE Right 01/22/2019   Procedure: OPEN REDUCTION INTERNAL FIXATION (ORIF) RIGHT CLAVICLE FRACTURE;  Surgeon: Mcarthur Rossetti, MD;  Location: [facility];  Service: Orthopedics;  Laterality: Right;    There were no vitals filed for this visit.  Subjective Assessment - 06/03/19 1355    Subjective  Patient reported feeling good and having no pain this date.    Pertinent History  S/P RT clavicle ORIF 01/22/19    Limitations  Lifting;House hold activities    Diagnostic tests  X-ray    Patient Stated Goals  Move his arm better    Currently in Pain?  No/denies                       [facility] Adult PT Treatment/Exercise - 06/03/19 0001      Shoulder Exercises: ROM/Strengthening   Other ROM/Strengthening Exercises  Towel stretch for IR and ER 3x20'' each. Wall walk to number 13 with some compensation noted x10.     Other ROM/Strengthening Exercises  supine AAROM  GHJ flexion and Abduction with pipe 10x10''.       Shoulder Exercises: Stretch   Other Shoulder Stretches  Posterior capsule stretch 3x20''     Other Shoulder Stretches  Doorway stretch 3x30''       Manual Therapy   Manual Therapy  Joint mobilization;Soft tissue mobilization;Passive ROM;Muscle Energy Technique    Manual therapy comments  All manual completed separately from other skilled interventions    Joint Mobilization  Inferior GHJ grade III joint mobilizations 20x to improve mobility; extremely tight    Soft tissue mobilization  To Right pectoral muscles to patient's tolerance    Passive ROM  Into GHJ Flexion and abduction x10    Muscle Energy Technique  Contract relax: Contract into right shoulder IR, relax into RT ER               PT Short Term Goals - 05/27/19 1626      PT SHORT TERM GOAL #1   Title  Patient will report understanding and regular compliance with HEP to improve ROM, strength, and functional mobility.    Time  2    Period  Weeks    Status  New    Target Date  06/08/19        PT Long Term Goals - 05/27/19 1629      PT LONG TERM GOAL #1   Title  Patient will demonstrate RT shoulder AROM WFL to perform household activities without difficulty.    Time  4    Period  Weeks    Status  On-going      PT LONG TERM GOAL #2   Title  Patient will demonstrate 5/5 strength in RT shoulder without reports of pain to return to work duties without difficulty.    Time  4    Period  Weeks    Status  On-going      PT LONG TERM GOAL #3   Title  Patient will demonstrate improvement of at least 10% on FOTO indicating improved percieved functional mobility.    Time  4    Period  Weeks    Status  On-going            Plan - 06/03/19 1435    Clinical Impression Statement  Continued with established POC this session. Patient continues to demonstrate significant limitations in AROM particularly in abduction. Patient with some pain with PROM, but improved with a  couple minutes of rest. Added pectoral STM this session as well as contract relax to improve mobility into external rotation.    Personal Factors and Comorbidities  Comorbidity 2    Comorbidities  Anxiety, depression    Examination-Activity Limitations  Reach Overhead;Carry    Examination-Participation Restrictions  Yard Work;Cleaning;Meal Prep;Community Activity    Stability/Clinical Decision Making  Stable/Uncomplicated    Rehab Potential  Good    PT Frequency  2x / week    PT Duration  4 weeks    PT Treatment/Interventions  ADLs/Self Care Home Management;Cryotherapy;Electrical Stimulation;Iontophoresis 4mg /ml Dexamethasone;Moist Heat;Ultrasound;Functional mobility training;Therapeutic activities;Therapeutic exercise;Balance training;Neuromuscular re-education;Patient/family education;Manual techniques;Scar mobilization;Passive range of motion;Dry needling;Energy conservation;Taping    PT Next Visit Plan  ROM all directions. STM, gentle joint mobilizations; continue contract/relax    PT Home Exercise Plan  05/25/19: Gentle flexion/abduction 15x; 05/27/19: AAROM flexion and abduction x10 each 1-2x/day    Consulted and Agree with Plan of Care  Patient       Patient will benefit from skilled therapeutic intervention in order to improve the following deficits and impairments:  Increased fascial restricitons, Improper body mechanics, Pain, Decreased mobility, Decreased scar mobility, Postural dysfunction, Decreased activity tolerance, Decreased endurance, Decreased range of motion, Decreased strength, Hypomobility, Impaired UE functional use  Visit Diagnosis: Stiffness of right shoulder, not elsewhere classified  Muscle weakness (generalized)     Problem List Patient Active Problem List   Diagnosis Date Noted  . Right clavicle fracture 01/22/2019  . Closed displaced fracture of shaft of right clavicle 01/20/2019   Clarene Critchley PT, DPT 2:37 PM, 06/03/19 [place], [place], [address] Phone: [phone]   Fax:  [phone]  Name: Micheal Mcgee MRN: [id number] Date of Birth: 01-22-1981

## 2019-06-08 ENCOUNTER — Other Ambulatory Visit: Payer: Self-pay

## 2019-06-08 ENCOUNTER — Encounter (HOSPITAL_COMMUNITY): Payer: Self-pay | Admitting: Physical Therapy

## 2019-06-08 ENCOUNTER — Ambulatory Visit (HOSPITAL_COMMUNITY): Payer: Medicare Other | Admitting: Physical Therapy

## 2019-06-08 DIAGNOSIS — M25611 Stiffness of right shoulder, not elsewhere classified: Secondary | ICD-10-CM

## 2019-06-08 DIAGNOSIS — M6281 Muscle weakness (generalized): Secondary | ICD-10-CM | POA: Diagnosis not present

## 2019-06-08 NOTE — Therapy (Signed)
[place], [place], [address] Phone: [phone]   Fax:  [phone]  Physical Therapy Treatment  Patient Details  Name: Micheal Mcgee MRN: [id number] Date of Birth: [birth year] Referring Provider (PT): Jean Rosenthal, MD   Encounter Date: 06/08/2019  PT End of Session - 06/08/19 1124    Visit Number  5    Number of Visits  8    Date for PT Re-Evaluation  06/22/19    Authorization Type  Primary: Medicare; Secondary: Medicaid    Authorization Time Period  05/25/19 - 06/22/19    Authorization - Visit Number  5    Authorization - Number of Visits  10    PT Start Time  1122    PT Stop Time  1201    PT Time Calculation (min)  39 min    Activity Tolerance  Patient tolerated treatment well    Behavior During Therapy  [facility] for tasks assessed/performed       Past Medical History:  Diagnosis Date  . Anxiety   . Depression     Past Surgical History:  Procedure Laterality Date  . MOLE REMOVAL    . ORIF CLAVICULAR FRACTURE Right 01/22/2019   Procedure: OPEN REDUCTION INTERNAL FIXATION (ORIF) RIGHT CLAVICLE FRACTURE;  Surgeon: Mcarthur Rossetti, MD;  Location: [facility];  Service: Orthopedics;  Laterality: Right;    There were no vitals filed for this visit.  Subjective Assessment - 06/08/19 1122    Subjective  Patient reported some soreness, but no pain. Stated he felt good after last session.    Pertinent History  S/P RT clavicle ORIF 01/22/19    Limitations  Lifting;House hold activities    Diagnostic tests  X-ray    Patient Stated Goals  Move his arm better    Currently in Pain?  No/denies                       [facility] Adult PT Treatment/Exercise - 06/08/19 0001      Shoulder Exercises: Standing   External Rotation  Strengthening;Right;15 reps;Theraband   Towel at elbow, tucked at side   Theraband Level (Shoulder External Rotation)  Level 2 (Red)    Row  Strengthening;Both;15  reps;Theraband    Theraband Level (Shoulder Row)  Level 2 (Red)      Shoulder Exercises: ROM/Strengthening   Other ROM/Strengthening Exercises  Wall walk to number 13 with some compensation noted x10.     Other ROM/Strengthening Exercises  Seated stool scoot into flexion and then abduction 10x10''. supine AAROM GHJ flexion and Abduction with pipe 10x10''.       Shoulder Exercises: Stretch   Other Shoulder Stretches  Posterior capsule stretch 3x20''       Manual Therapy   Manual Therapy  Passive ROM    Manual therapy comments  All manual completed separately from other skilled interventions    Passive ROM  Into GHJ Flexion and abduction x15               PT Short Term Goals - 05/27/19 1626      PT SHORT TERM GOAL #1   Title  Patient will report understanding and regular compliance with HEP to improve ROM, strength, and functional mobility.    Time  2    Period  Weeks    Status  New    Target Date  06/08/19        PT Long Term Goals -  05/27/19 1629      PT LONG TERM GOAL #1   Title  Patient will demonstrate RT shoulder AROM WFL to perform household activities without difficulty.    Time  4    Period  Weeks    Status  On-going      PT LONG TERM GOAL #2   Title  Patient will demonstrate 5/5 strength in RT shoulder without reports of pain to return to work duties without difficulty.    Time  4    Period  Weeks    Status  On-going      PT LONG TERM GOAL #3   Title  Patient will demonstrate improvement of at least 10% on FOTO indicating improved percieved functional mobility.    Time  4    Period  Weeks    Status  On-going            Plan - 06/08/19 1206    Clinical Impression Statement  This session progressed with strengthening into shoulder external rotation and rows, but not past the line of the patient's trunk to reduce stress on the clavicle. Patient tolerated these well without any reports of pain. Continued with PROM into shoulder flexion and  abduction. Patient still demonstrated significant deficits in ROM particularly into Abduction. Patient would benefit from continued skilled physical therapy.    Personal Factors and Comorbidities  Comorbidity 2    Comorbidities  Anxiety, depression    Examination-Activity Limitations  Reach Overhead;Carry    Examination-Participation Restrictions  Yard Work;Cleaning;Meal Prep;Community Activity    Stability/Clinical Decision Making  Stable/Uncomplicated    Rehab Potential  Good    PT Frequency  2x / week    PT Duration  4 weeks    PT Treatment/Interventions  ADLs/Self Care Home Management;Cryotherapy;Electrical Stimulation;Iontophoresis 4mg /ml Dexamethasone;Moist Heat;Ultrasound;Functional mobility training;Therapeutic activities;Therapeutic exercise;Balance training;Neuromuscular re-education;Patient/family education;Manual techniques;Scar mobilization;Passive range of motion;Dry needling;Energy conservation;Taping    PT Next Visit Plan  ROM all directions. STM, gentle joint mobilizations; continue contract/relax    PT Home Exercise Plan  05/25/19: Gentle flexion/abduction 15x; 05/27/19: AAROM flexion and abduction x10 each 1-2x/day    Consulted and Agree with Plan of Care  Patient       Patient will benefit from skilled therapeutic intervention in order to improve the following deficits and impairments:  Increased fascial restricitons, Improper body mechanics, Pain, Decreased mobility, Decreased scar mobility, Postural dysfunction, Decreased activity tolerance, Decreased endurance, Decreased range of motion, Decreased strength, Hypomobility, Impaired UE functional use  Visit Diagnosis: Stiffness of right shoulder, not elsewhere classified  Muscle weakness (generalized)     Problem List Patient Active Problem List   Diagnosis Date Noted  . Right clavicle fracture 01/22/2019  . Closed displaced fracture of shaft of right clavicle 01/20/2019   Clarene Critchley PT, DPT 12:12 PM,  06/08/19 [place], [place], [address] Phone: [phone]   Fax:  [phone]  Name: Micheal Mcgee MRN: [id number] Date of Birth: 1980-07-16

## 2019-06-10 ENCOUNTER — Other Ambulatory Visit: Payer: Self-pay

## 2019-06-10 ENCOUNTER — Ambulatory Visit (HOSPITAL_COMMUNITY): Payer: Medicare Other | Admitting: Physical Therapy

## 2019-06-10 ENCOUNTER — Encounter (HOSPITAL_COMMUNITY): Payer: Self-pay | Admitting: Physical Therapy

## 2019-06-10 DIAGNOSIS — M25611 Stiffness of right shoulder, not elsewhere classified: Secondary | ICD-10-CM

## 2019-06-10 DIAGNOSIS — M6281 Muscle weakness (generalized): Secondary | ICD-10-CM | POA: Diagnosis not present

## 2019-06-10 NOTE — Therapy (Addendum)
[place], [place], [address] Phone: [phone]   Fax:  [phone]  Physical Therapy Treatment  Patient Details  Name: Micheal Mcgee MRN: [id number] Date of Birth: [birth year] Referring Provider (PT): Jean Rosenthal, MD   Encounter Date: 06/10/2019  PT End of Session - 06/10/19 1353    Visit Number  6    Number of Visits  8    Date for PT Re-Evaluation  06/22/19    Authorization Type  Primary: Medicare; Secondary: Medicaid    Authorization Time Period  05/25/19 - 06/22/19    Authorization - Visit Number  6    Authorization - Number of Visits  10    PT Start Time  1306    PT Stop Time  1350    PT Time Calculation (min)  44 min    Activity Tolerance  Patient tolerated treatment well    Behavior During Therapy  [facility] for tasks assessed/performed       Past Medical History:  Diagnosis Date  . Anxiety   . Depression     Past Surgical History:  Procedure Laterality Date  . MOLE REMOVAL    . ORIF CLAVICULAR FRACTURE Right 01/22/2019   Procedure: OPEN REDUCTION INTERNAL FIXATION (ORIF) RIGHT CLAVICLE FRACTURE;  Surgeon: Mcarthur Rossetti, MD;  Location: [facility];  Service: Orthopedics;  Laterality: Right;    There were no vitals filed for this visit.  Subjective Assessment - 06/10/19 1315    Subjective  Patient denied any pain currently.    Pertinent History  S/P RT clavicle ORIF 01/22/19    Limitations  Lifting;House hold activities    Diagnostic tests  X-ray    Patient Stated Goals  Move his arm better    Currently in Pain?  No/denies         [facility] PT Assessment - 06/10/19 0001      AROM   Right Shoulder ABduction  84 Degrees   supine, was 115 previously which was measured standing                  [facility] Adult PT Treatment/Exercise - 06/10/19 0001      Shoulder Exercises: Supine   Flexion  AAROM;Right;10 reps   with cane   ABduction  AAROM;Right;10 reps   with cane    Other Supine Exercises  Snow angels, supine on mat table x10 working on abduction.       Shoulder Exercises: Standing   External Rotation  Strengthening;Right;15 reps;Theraband    Theraband Level (Shoulder External Rotation)  Level 2 (Red)    Row  Strengthening;Both;15 reps;Theraband    Theraband Level (Shoulder Row)  Level 2 (Red)      Shoulder Exercises: Stretch   Other Shoulder Stretches  Sleeper stretch 3x20'' RT      Manual Therapy   Manual Therapy  Soft tissue mobilization    Manual therapy comments  All manual completed separately from other skilled interventions    Soft tissue mobilization  PT prone: STM to right periscapular mms, noted tightness and restrictions throughout, to improve mobility               PT Short Term Goals - 05/27/19 1626      PT SHORT TERM GOAL #1   Title  Patient will report understanding and regular compliance with HEP to improve ROM, strength, and functional mobility.    Time  2    Period  Weeks    Status  New    Target Date  06/08/19        PT Long Term Goals - 05/27/19 1629      PT LONG TERM GOAL #1   Title  Patient will demonstrate RT shoulder AROM WFL to perform household activities without difficulty.    Time  4    Period  Weeks    Status  On-going      PT LONG TERM GOAL #2   Title  Patient will demonstrate 5/5 strength in RT shoulder without reports of pain to return to work duties without difficulty.    Time  4    Period  Weeks    Status  On-going      PT LONG TERM GOAL #3   Title  Patient will demonstrate improvement of at least 10% on FOTO indicating improved percieved functional mobility.    Time  4    Period  Weeks    Status  On-going            Plan - 06/10/19 1352    Clinical Impression Statement  Re-measured patient's shoulder abduction this session laying supine and found to be 84 degrees compared to previous measurement of 115 which had been measured in standing. Suspect, previous trunk compensation.  This session added supine snow-angel exercise to work on active shoulder abduction. Ended this session with STM to reduce restrictions in periscapular muscles. Patient reported overall improvement in function since beginning therapy.    Personal Factors and Comorbidities  Comorbidity 2    Comorbidities  Anxiety, depression    Examination-Activity Limitations  Reach Overhead;Carry    Examination-Participation Restrictions  Yard Work;Cleaning;Meal Prep;Community Activity    Stability/Clinical Decision Making  Stable/Uncomplicated    Rehab Potential  Good    PT Frequency  2x / week    PT Duration  4 weeks    PT Treatment/Interventions  ADLs/Self Care Home Management;Cryotherapy;Electrical Stimulation;Iontophoresis 4mg /ml Dexamethasone;Moist Heat;Ultrasound;Functional mobility training;Therapeutic activities;Therapeutic exercise;Balance training;Neuromuscular re-education;Patient/family education;Manual techniques;Scar mobilization;Passive range of motion;Dry needling;Energy conservation;Taping    PT Next Visit Plan  ROM all directions. STM, gentle joint mobilizations; continue contract/relax. Possible referral for DN    PT Home Exercise Plan  05/25/19: Gentle flexion/abduction 15x; 05/27/19: AAROM flexion and abduction x10 each 1-2x/day; 06/10/19: posterior capsule stretch 3x30'' daily    Consulted and Agree with Plan of Care  Patient       Patient will benefit from skilled therapeutic intervention in order to improve the following deficits and impairments:  Increased fascial restricitons, Improper body mechanics, Pain, Decreased mobility, Decreased scar mobility, Postural dysfunction, Decreased activity tolerance, Decreased endurance, Decreased range of motion, Decreased strength, Hypomobility, Impaired UE functional use  Visit Diagnosis: Stiffness of right shoulder, not elsewhere classified  Muscle weakness (generalized)     Problem List Patient Active Problem List   Diagnosis Date Noted  .  Right clavicle fracture 01/22/2019  . Closed displaced fracture of shaft of right clavicle 01/20/2019   Clarene Critchley PT, DPT 1:56 PM, 06/10/19 [place], [place], [address] Phone: [phone]   Fax:  [phone]  Name: Micheal Mcgee MRN: [id number] Date of Birth: [birth year]

## 2019-06-15 ENCOUNTER — Ambulatory Visit (HOSPITAL_COMMUNITY): Payer: Medicare Other | Attending: Orthopaedic Surgery | Admitting: Physical Therapy

## 2019-06-15 ENCOUNTER — Encounter (HOSPITAL_COMMUNITY): Payer: Self-pay | Admitting: Physical Therapy

## 2019-06-15 ENCOUNTER — Other Ambulatory Visit: Payer: Self-pay

## 2019-06-15 DIAGNOSIS — M6281 Muscle weakness (generalized): Secondary | ICD-10-CM | POA: Insufficient documentation

## 2019-06-15 DIAGNOSIS — M25611 Stiffness of right shoulder, not elsewhere classified: Secondary | ICD-10-CM | POA: Diagnosis not present

## 2019-06-15 NOTE — Therapy (Signed)
[place], [place], [address] Phone: [phone]   Fax:  [phone]  Physical Therapy Treatment  Patient Details  Name: Micheal Mcgee MRN: [id number] Date of Birth: [birth year] Referring Provider (PT): Jean Rosenthal, MD   Encounter Date: 06/15/2019  PT End of Session - 06/15/19 1120    Visit Number  7    Number of Visits  8    Date for PT Re-Evaluation  06/22/19    Authorization Type  Primary: Medicare; Secondary: Medicaid    Authorization Time Period  05/25/19 - 06/22/19    Authorization - Visit Number  7    Authorization - Number of Visits  10    PT Start Time  1118    PT Stop Time  1205    PT Time Calculation (min)  47 min    Activity Tolerance  Patient tolerated treatment well    Behavior During Therapy  [facility] for tasks assessed/performed       Past Medical History:  Diagnosis Date  . Anxiety   . Depression     Past Surgical History:  Procedure Laterality Date  . MOLE REMOVAL    . ORIF CLAVICULAR FRACTURE Right 01/22/2019   Procedure: OPEN REDUCTION INTERNAL FIXATION (ORIF) RIGHT CLAVICLE FRACTURE;  Surgeon: Mcarthur Rossetti, MD;  Location: [facility];  Service: Orthopedics;  Laterality: Right;    There were no vitals filed for this visit.                    [facility] Adult PT Treatment/Exercise - 06/15/19 0001      Shoulder Exercises: Supine   Flexion  AAROM;Right;10 reps   with cane   ABduction  AAROM;Right;10 reps   with cane     Shoulder Exercises: Standing   External Rotation  Strengthening;Right;15 reps;Theraband    Theraband Level (Shoulder External Rotation)  Level 2 (Red)    Row  Strengthening;Both;15 reps;Theraband    Theraband Level (Shoulder Row)  Level 2 (Red)      Shoulder Exercises: Stretch   Other Shoulder Stretches  Sleeper stretch 3x30'' RT      Manual Therapy   Manual Therapy  Soft tissue mobilization    Manual therapy comments  All manual completed  separately from other skilled interventions    Soft tissue mobilization  PT prone: STM to right periscapular mms, noted tightness and restrictions throughout, to improve mobility               PT Short Term Goals - 05/27/19 1626      PT SHORT TERM GOAL #1   Title  Patient will report understanding and regular compliance with HEP to improve ROM, strength, and functional mobility.    Time  2    Period  Weeks    Status  New    Target Date  06/08/19        PT Long Term Goals - 05/27/19 1629      PT LONG TERM GOAL #1   Title  Patient will demonstrate RT shoulder AROM WFL to perform household activities without difficulty.    Time  4    Period  Weeks    Status  On-going      PT LONG TERM GOAL #2   Title  Patient will demonstrate 5/5 strength in RT shoulder without reports of pain to return to work duties without difficulty.    Time  4    Period  Weeks  Status  On-going      PT LONG TERM GOAL #3   Title  Patient will demonstrate improvement of at least 10% on FOTO indicating improved percieved functional mobility.    Time  4    Period  Weeks    Status  On-going            Plan - 06/15/19 1140    Clinical Impression Statement  Focused on mobility and strengthening this session. Patient continued to require minimal cueing to perform external rotation, rather than compensating with elbow extension with strengthening exercise. Patient with noted muscular restrictions in right periscapular region noted with manual therapy this session which continued to focus on with soft tissue mobilization. Plan to re-assess patient next session.    Personal Factors and Comorbidities  Comorbidity 2    Comorbidities  Anxiety, depression    Examination-Activity Limitations  Reach Overhead;Carry    Examination-Participation Restrictions  Yard Work;Cleaning;Meal Prep;Community Activity    Stability/Clinical Decision Making  Stable/Uncomplicated    Rehab Potential  Good    PT Frequency   2x / week    PT Duration  4 weeks    PT Treatment/Interventions  ADLs/Self Care Home Management;Cryotherapy;Electrical Stimulation;Iontophoresis 4mg /ml Dexamethasone;Moist Heat;Ultrasound;Functional mobility training;Therapeutic activities;Therapeutic exercise;Balance training;Neuromuscular re-education;Patient/family education;Manual techniques;Scar mobilization;Passive range of motion;Dry needling;Energy conservation;Taping    PT Next Visit Plan  Perform re-assessment next session; provide theraband for strengthening exercises, provide tennis ball for self-massage    PT Home Exercise Plan  05/25/19: Gentle flexion/abduction 15x; 05/27/19: AAROM flexion and abduction x10 each 1-2x/day; 06/10/19: posterior capsule stretch 3x30'' daily    Consulted and Agree with Plan of Care  Patient       Patient will benefit from skilled therapeutic intervention in order to improve the following deficits and impairments:  Increased fascial restricitons, Improper body mechanics, Pain, Decreased mobility, Decreased scar mobility, Postural dysfunction, Decreased activity tolerance, Decreased endurance, Decreased range of motion, Decreased strength, Hypomobility, Impaired UE functional use  Visit Diagnosis: Stiffness of right shoulder, not elsewhere classified  Muscle weakness (generalized)     Problem List Patient Active Problem List   Diagnosis Date Noted  . Right clavicle fracture 01/22/2019  . Closed displaced fracture of shaft of right clavicle 01/20/2019   Clarene Critchley PT, DPT 12:14 PM, 06/15/19 [place], [place], [address] Phone: [phone]   Fax:  [phone]  Name: Micheal Mcgee MRN: [id number] Date of Birth: 1980-08-03

## 2019-06-17 ENCOUNTER — Encounter (HOSPITAL_COMMUNITY): Payer: Self-pay | Admitting: Physical Therapy

## 2019-06-17 ENCOUNTER — Other Ambulatory Visit: Payer: Self-pay

## 2019-06-17 ENCOUNTER — Ambulatory Visit (HOSPITAL_COMMUNITY): Payer: Medicare Other | Admitting: Physical Therapy

## 2019-06-17 DIAGNOSIS — M25611 Stiffness of right shoulder, not elsewhere classified: Secondary | ICD-10-CM

## 2019-06-17 DIAGNOSIS — M6281 Muscle weakness (generalized): Secondary | ICD-10-CM | POA: Diagnosis not present

## 2019-06-17 NOTE — Therapy (Signed)
[place] [address] [place], [place], [address] Phone: [phone]   Fax:  [phone]  Physical Therapy Treatment / Progress Note  Patient Details  Name: Micheal Mcgee MRN: [id number] Date of Birth: 29-Jul-1980 Referring Provider (PT): Jean Rosenthal, MD   Encounter Date: 06/17/2019   Progress Note Reporting Period 05/25/19 to 06/17/19  See note below for Objective Data and Assessment of Progress/Goals.       PT End of Session - 06/17/19 1126    Visit Number  8    Number of Visits  14    Date for PT Re-Evaluation  07/08/19    Authorization Type  Primary: Medicare; Secondary: Medicaid    Authorization Time Period  05/25/19 - 06/22/19; 06/17/19 - 07/08/19    Authorization - Visit Number  8    Authorization - Number of Visits  8    PT Start Time  [id number]    PT Stop Time  1155    PT Time Calculation (min)  34 min    Activity Tolerance  Patient tolerated treatment well    Behavior During Therapy  [facility] for tasks assessed/performed       Past Medical History:  Diagnosis Date  . Anxiety   . Depression     Past Surgical History:  Procedure Laterality Date  . MOLE REMOVAL    . ORIF CLAVICULAR FRACTURE Right 01/22/2019   Procedure: OPEN REDUCTION INTERNAL FIXATION (ORIF) RIGHT CLAVICLE FRACTURE;  Surgeon: Mcarthur Rossetti, MD;  Location: [facility];  Service: Orthopedics;  Laterality: Right;    There were no vitals filed for this visit.  Subjective Assessment - 06/17/19 1124    Subjective  Reported feeling improvement of at least 75% improvement since beginning therapy. Reported some minimal soreness but no pain.    Pertinent History  S/P RT clavicle ORIF 01/22/19    Limitations  Lifting;House hold activities    Diagnostic tests  X-ray    Patient Stated Goals  Move his arm better    Currently in Pain?  No/denies         [facility] PT Assessment - 06/17/19 0001      Assessment   Medical Diagnosis  S/P ORIF clavicle    Referring Provider (PT)  Jean Rosenthal, MD    Onset Date/Surgical Date  01/22/19    Hand Dominance  Right    Next MD Visit  07/12/19      Observation/Other Assessments   Observations  Noted scapular winging with patient in prone position on the right side. With active shoulder abduction noted early scapular upward rotation in standing.     Focus on Therapeutic Outcomes (FOTO)   77%   was 62%     AROM   Right Shoulder Extension  50 Degrees   was 45   Right Shoulder Flexion  133 Degrees   was 110   Right Shoulder ABduction  90 Degrees   was 84 when laying supine. In standing 140 degs (compensate)   Right Shoulder External Rotation  80 Degrees   was 90     Strength   Right Shoulder Flexion  4+/5    Right Shoulder ABduction  5/5      Palpation   Palpation comment  Some tightness still noted in RT shoulder internal rotators                   [facility] Adult PT Treatment/Exercise - 06/17/19 0001      Manual Therapy  Manual Therapy  Soft tissue mobilization    Manual therapy comments  All manual completed separately from other skilled interventions    Soft tissue mobilization  PT prone: STM to right periscapular mms, noted tightness and restrictions throughout, to improve mobility             PT Education - 06/17/19 1232    Education Details  Discussed re-assessment findings, muscular contributions to scapulo-thoracic rhythm using visual aids.    Person(s) Educated  Patient    Methods  Explanation    Comprehension  Verbalized understanding       PT Short Term Goals - 06/17/19 1408      PT SHORT TERM GOAL #1   Title  Patient will report understanding and regular compliance with HEP to improve ROM, strength, and functional mobility.    Time  2    Period  Weeks    Status  Achieved    Target Date  06/08/19        PT Long Term Goals - 06/17/19 1422      PT LONG TERM GOAL #1   Title  Patient will demonstrate RT shoulder AROM WFL to perform household  activities without difficulty.    Baseline  06/17/19: See objective measures    Time  4    Period  Weeks    Status  On-going      PT LONG TERM GOAL #2   Title  Patient will demonstrate 5/5 strength in RT shoulder without reports of pain to return to work duties without difficulty.    Time  4    Period  Weeks    Status  On-going      PT LONG TERM GOAL #3   Title  Patient will demonstrate improvement of at least 10% on FOTO indicating improved percieved functional mobility.    Time  4    Period  Weeks    Status  Achieved            Plan - 06/17/19 1427    Clinical Impression Statement  Performed re-assessment of patient's progress towards goals. Patient continues to be most limited in right AROM, but has made improvement in mobility overall. This session noted patient demonstrated right sided scapular winging while in prone and noted early upward rotation with shoulder abduction on the right side. Patient would benefit from continued skilled physical therapy to address the abovementioned deficits and continue progressing towards functional goals.    Personal Factors and Comorbidities  Comorbidity 2    Comorbidities  Anxiety, depression    Examination-Activity Limitations  Reach Overhead;Carry    Examination-Participation Restrictions  Yard Work;Cleaning;Meal Prep;Community Activity    Stability/Clinical Decision Making  Stable/Uncomplicated    Rehab Potential  Good    PT Frequency  2x / week    PT Duration  4 weeks    PT Treatment/Interventions  ADLs/Self Care Home Management;Cryotherapy;Electrical Stimulation;Iontophoresis 4mg /ml Dexamethasone;Moist Heat;Ultrasound;Functional mobility training;Therapeutic activities;Therapeutic exercise;Balance training;Neuromuscular re-education;Patient/family education;Manual techniques;Scar mobilization;Passive range of motion;Dry needling;Energy conservation;Taping    PT Next Visit Plan  provide theraband for strengthening exercises, provide  tennis ball for self-massage, serratus anterior strengthening    PT Home Exercise Plan  05/25/19: Gentle flexion/abduction 15x; 05/27/19: AAROM flexion and abduction x10 each 1-2x/day; 06/10/19: posterior capsule stretch 3x30'' daily    Consulted and Agree with Plan of Care  Patient       Patient will benefit from skilled therapeutic intervention in order to improve the following deficits and impairments:  Increased fascial  restricitons, Improper body mechanics, Pain, Decreased mobility, Decreased scar mobility, Postural dysfunction, Decreased activity tolerance, Decreased endurance, Decreased range of motion, Decreased strength, Hypomobility, Impaired UE functional use  Visit Diagnosis: Stiffness of right shoulder, not elsewhere classified  Muscle weakness (generalized)     Problem List Patient Active Problem List   Diagnosis Date Noted  . Right clavicle fracture 01/22/2019  . Closed displaced fracture of shaft of right clavicle 01/20/2019   Clarene Critchley PT, DPT 2:30 PM, 06/17/19 [place], [place], [address] Phone: [phone]   Fax:  [phone]  Name: SOUL COMPANION MRN: [id number] Date of Birth: Feb 02, 1981

## 2019-06-22 ENCOUNTER — Encounter (HOSPITAL_COMMUNITY): Payer: Self-pay | Admitting: Physical Therapy

## 2019-06-22 ENCOUNTER — Other Ambulatory Visit: Payer: Self-pay

## 2019-06-22 ENCOUNTER — Ambulatory Visit (HOSPITAL_COMMUNITY): Payer: Medicare Other | Admitting: Physical Therapy

## 2019-06-22 DIAGNOSIS — M6281 Muscle weakness (generalized): Secondary | ICD-10-CM

## 2019-06-22 DIAGNOSIS — M25611 Stiffness of right shoulder, not elsewhere classified: Secondary | ICD-10-CM

## 2019-06-22 NOTE — Therapy (Signed)
[place], [place], [address] Phone: [phone]   Fax:  [phone]  Physical Therapy Treatment  Patient Details  Name: Micheal Mcgee MRN: [id number] Date of Birth: November 02, 1980 Referring Provider (PT): Jean Rosenthal, MD   Encounter Date: 06/22/2019  PT End of Session - 06/22/19 1122    Visit Number  9    Number of Visits  14    Date for PT Re-Evaluation  07/08/19    Authorization Type  Primary: Medicare; Secondary: Medicaid    Authorization Time Period  05/25/19 - 06/22/19; 06/17/19 - 07/08/19    Authorization - Visit Number  1    Authorization - Number of Visits  6    PT Start Time  [id number]    PT Stop Time  1200    PT Time Calculation (min)  39 min    Activity Tolerance  Patient tolerated treatment well    Behavior During Therapy  [facility] for tasks assessed/performed       Past Medical History:  Diagnosis Date  . Anxiety   . Depression     Past Surgical History:  Procedure Laterality Date  . MOLE REMOVAL    . ORIF CLAVICULAR FRACTURE Right 01/22/2019   Procedure: OPEN REDUCTION INTERNAL FIXATION (ORIF) RIGHT CLAVICLE FRACTURE;  Surgeon: Mcarthur Rossetti, MD;  Location: [facility];  Service: Orthopedics;  Laterality: Right;    There were no vitals filed for this visit.  Subjective Assessment - 06/22/19 1122    Subjective  Patient reported feeling good this session. No reported pain.    Pertinent History  S/P RT clavicle ORIF 01/22/19    Limitations  Lifting;House hold activities    Diagnostic tests  X-ray    Patient Stated Goals  Move his arm better    Currently in Pain?  No/denies                       [facility] Adult PT Treatment/Exercise - 06/22/19 0001      Shoulder Exercises: Supine   Protraction  Strengthening;Both;15 reps    Protraction Limitations  Serratus anterior strengthening using foam roll cues for proper form.       Shoulder Exercises: Sidelying   Other Sidelying  Exercises  RT active shoulder flexion x15       Shoulder Exercises: Standing   Flexion  Strengthening;Both;15 reps    Shoulder Flexion Weight (lbs)  2      Manual Therapy   Manual Therapy  Soft tissue mobilization    Manual therapy comments  All manual completed separately from other skilled interventions    Soft tissue mobilization  PT prone: STM to right periscapular mms, noted tightness and restrictions throughout, to improve mobility               PT Short Term Goals - 06/17/19 1408      PT SHORT TERM GOAL #1   Title  Patient will report understanding and regular compliance with HEP to improve ROM, strength, and functional mobility.    Time  2    Period  Weeks    Status  Achieved    Target Date  06/08/19        PT Long Term Goals - 06/17/19 1422      PT LONG TERM GOAL #1   Title  Patient will demonstrate RT shoulder AROM WFL to perform household activities without difficulty.    Baseline  06/17/19: See objective measures  Time  4    Period  Weeks    Status  On-going      PT LONG TERM GOAL #2   Title  Patient will demonstrate 5/5 strength in RT shoulder without reports of pain to return to work duties without difficulty.    Time  4    Period  Weeks    Status  On-going      PT LONG TERM GOAL #3   Title  Patient will demonstrate improvement of at least 10% on FOTO indicating improved percieved functional mobility.    Time  4    Period  Weeks    Status  Achieved            Plan - 06/22/19 1313    Clinical Impression Statement  Added serratus anterior strengthening exercise this session to work on improving overall shoulder activation. Patient required significant verbal cues, tactile cues, and demonstration for proper form with exercise. Added resisted shoulder flexion exercise this session, which patient tolerated well. Ended session with manual therapy focusing on decreasing muscular restrictions. Patient would benefit from continued skilled physical  therapy.    Personal Factors and Comorbidities  Comorbidity 2    Comorbidities  Anxiety, depression    Examination-Activity Limitations  Reach Overhead;Carry    Examination-Participation Restrictions  Yard Work;Cleaning;Meal Prep;Community Activity    Stability/Clinical Decision Making  Stable/Uncomplicated    Rehab Potential  Good    PT Frequency  2x / week    PT Duration  4 weeks    PT Treatment/Interventions  ADLs/Self Care Home Management;Cryotherapy;Electrical Stimulation;Iontophoresis 4mg /ml Dexamethasone;Moist Heat;Ultrasound;Functional mobility training;Therapeutic activities;Therapeutic exercise;Balance training;Neuromuscular re-education;Patient/family education;Manual techniques;Scar mobilization;Passive range of motion;Dry needling;Energy conservation;Taping    PT Next Visit Plan  provide theraband for strengthening exercises, provide tennis ball for self-massage    PT Home Exercise Plan  05/25/19: Gentle flexion/abduction 15x; 05/27/19: AAROM flexion and abduction x10 each 1-2x/day; 06/10/19: posterior capsule stretch 3x30'' daily    Consulted and Agree with Plan of Care  Patient       Patient will benefit from skilled therapeutic intervention in order to improve the following deficits and impairments:  Increased fascial restricitons, Improper body mechanics, Pain, Decreased mobility, Decreased scar mobility, Postural dysfunction, Decreased activity tolerance, Decreased endurance, Decreased range of motion, Decreased strength, Hypomobility, Impaired UE functional use  Visit Diagnosis: Stiffness of right shoulder, not elsewhere classified  Muscle weakness (generalized)     Problem List Patient Active Problem List   Diagnosis Date Noted  . Right clavicle fracture 01/22/2019  . Closed displaced fracture of shaft of right clavicle 01/20/2019   Micheal Mcgee PT, DPT 1:15 PM, 06/22/19 [place], [place], [address] Phone: [phone]   Fax:  [phone]  Name: Micheal Mcgee MRN: [id number] Date of Birth: [birth year]

## 2019-06-24 ENCOUNTER — Ambulatory Visit (HOSPITAL_COMMUNITY): Payer: Medicare Other | Admitting: Physical Therapy

## 2019-06-24 ENCOUNTER — Encounter (HOSPITAL_COMMUNITY): Payer: Self-pay | Admitting: Physical Therapy

## 2019-06-24 ENCOUNTER — Other Ambulatory Visit: Payer: Self-pay

## 2019-06-24 DIAGNOSIS — M25611 Stiffness of right shoulder, not elsewhere classified: Secondary | ICD-10-CM | POA: Diagnosis not present

## 2019-06-24 DIAGNOSIS — M6281 Muscle weakness (generalized): Secondary | ICD-10-CM | POA: Diagnosis not present

## 2019-06-24 NOTE — Therapy (Signed)
[place], [place], [address] Phone: [phone]   Fax:  [phone]  Physical Therapy Treatment  Patient Details  Name: Micheal Mcgee MRN: [id number] Date of Birth: September 06, 1980 Referring Provider (PT): Jean Rosenthal, MD   Encounter Date: 06/24/2019  PT End of Session - 06/24/19 1446    Visit Number  10    Number of Visits  14    Date for PT Re-Evaluation  07/08/19    Authorization Type  Primary: Medicare; Secondary: Medicaid    Authorization Time Period  05/25/19 - 06/22/19; 06/17/19 - 07/08/19    Authorization - Visit Number  2    Authorization - Number of Visits  6    PT Start Time  [id number]    PT Stop Time  1515    PT Time Calculation (min)  40 min    Activity Tolerance  Patient tolerated treatment well    Behavior During Therapy  [facility] for tasks assessed/performed       Past Medical History:  Diagnosis Date  . Anxiety   . Depression     Past Surgical History:  Procedure Laterality Date  . MOLE REMOVAL    . ORIF CLAVICULAR FRACTURE Right 01/22/2019   Procedure: OPEN REDUCTION INTERNAL FIXATION (ORIF) RIGHT CLAVICLE FRACTURE;  Surgeon: Mcarthur Rossetti, MD;  Location: [facility];  Service: Orthopedics;  Laterality: Right;    There were no vitals filed for this visit.  Subjective Assessment - 06/24/19 1437    Subjective  Patient reported he is feeling alright, and no reported pain.    Pertinent History  S/P RT clavicle ORIF 01/22/19    Limitations  Lifting;House hold activities    Diagnostic tests  X-ray    Patient Stated Goals  Move his arm better    Currently in Pain?  No/denies                       [facility] Adult PT Treatment/Exercise - 06/24/19 0001      Shoulder Exercises: Supine   Protraction  Strengthening;Both;15 reps    Protraction Limitations  Serratus anterior strengthening using foam roll cues for proper form.       Shoulder Exercises: Sidelying   Other Sidelying  Exercises  RT active shoulder flexion x15       Shoulder Exercises: Standing   Flexion  Strengthening;Both;15 reps    Shoulder Flexion Weight (lbs)  2    Other Standing Exercises  Wall push ups x 15    Other Standing Exercises  Education on self-massage using a tennis ball against the wall in standing      Manual Therapy   Manual Therapy  Soft tissue mobilization    Manual therapy comments  All manual completed separately from other skilled interventions    Soft tissue mobilization  STM to right periscapular mms, noted tightness and restrictions throughout, to improve mobility. STM to right pectoral anterior region.              PT Education - 06/24/19 1619    Education Details  Educated on using a tennis ball for STM at home. Educated on using theraband in the door frame at home.    Person(s) Educated  Patient    Methods  Explanation;Verbal cues;Demonstration    Comprehension  Verbalized understanding       PT Short Term Goals - 06/17/19 1408      PT SHORT TERM GOAL #1   Title  Patient will report understanding and regular compliance with HEP to improve ROM, strength, and functional mobility.    Time  2    Period  Weeks    Status  Achieved    Target Date  06/08/19        PT Long Term Goals - 06/17/19 1422      PT LONG TERM GOAL #1   Title  Patient will demonstrate RT shoulder AROM WFL to perform household activities without difficulty.    Baseline  06/17/19: See objective measures    Time  4    Period  Weeks    Status  On-going      PT LONG TERM GOAL #2   Title  Patient will demonstrate 5/5 strength in RT shoulder without reports of pain to return to work duties without difficulty.    Time  4    Period  Weeks    Status  On-going      PT LONG TERM GOAL #3   Title  Patient will demonstrate improvement of at least 10% on FOTO indicating improved percieved functional mobility.    Time  4    Period  Weeks    Status  Achieved            Plan - 06/24/19  1449    Clinical Impression Statement  Continued to focus on shoulder ROM and improving mechanics of shoulder movement particularly by strengthening the Serratus Anterior muscle. Provided minimal verbal cues to improve wall push-up form this session. Patient tolerated exercises well and reported discomfort only at end of ROM. Educated patient on how to perform strengthening exercises using theraband in a door at home as well as educated patient on how to perform a self-massage using a tennis ball at home.    Personal Factors and Comorbidities  Comorbidity 2    Comorbidities  Anxiety, depression    Examination-Activity Limitations  Reach Overhead;Carry    Examination-Participation Restrictions  Yard Work;Cleaning;Meal Prep;Community Activity    Stability/Clinical Decision Making  Stable/Uncomplicated    Rehab Potential  Good    PT Frequency  2x / week    PT Duration  4 weeks    PT Treatment/Interventions  ADLs/Self Care Home Management;Cryotherapy;Electrical Stimulation;Iontophoresis 4mg /ml Dexamethasone;Moist Heat;Ultrasound;Functional mobility training;Therapeutic activities;Therapeutic exercise;Balance training;Neuromuscular re-education;Patient/family education;Manual techniques;Scar mobilization;Passive range of motion;Dry needling;Energy conservation;Taping    PT Next Visit Plan  Continue with strengthening exercises    PT Home Exercise Plan  05/25/19: Gentle flexion/abduction 15x; 05/27/19: AAROM flexion and abduction x10 each 1-2x/day; 06/10/19: posterior capsule stretch 3x30'' daily; 06/24/19: self-massage with tennis ball. ER and rows with theraband    Consulted and Agree with Plan of Care  Patient       Patient will benefit from skilled therapeutic intervention in order to improve the following deficits and impairments:  Increased fascial restricitons, Improper body mechanics, Pain, Decreased mobility, Decreased scar mobility, Postural dysfunction, Decreased activity tolerance, Decreased  endurance, Decreased range of motion, Decreased strength, Hypomobility, Impaired UE functional use  Visit Diagnosis: Stiffness of right shoulder, not elsewhere classified  Muscle weakness (generalized)     Problem List Patient Active Problem List   Diagnosis Date Noted  . Right clavicle fracture 01/22/2019  . Closed displaced fracture of shaft of right clavicle 01/20/2019   Clarene Critchley PT, DPT 4:33 PM, 06/24/19 [place], [place], [address] Phone: [phone]   Fax:  [phone]  Name: Micheal Mcgee MRN: [id number] Date of  Birth: 1981/02/21

## 2019-06-29 ENCOUNTER — Ambulatory Visit (HOSPITAL_COMMUNITY): Payer: Medicare Other | Admitting: Physical Therapy

## 2019-06-29 ENCOUNTER — Encounter (HOSPITAL_COMMUNITY): Payer: Self-pay | Admitting: Physical Therapy

## 2019-06-29 ENCOUNTER — Other Ambulatory Visit: Payer: Self-pay

## 2019-06-29 DIAGNOSIS — M25611 Stiffness of right shoulder, not elsewhere classified: Secondary | ICD-10-CM

## 2019-06-29 DIAGNOSIS — M6281 Muscle weakness (generalized): Secondary | ICD-10-CM | POA: Diagnosis not present

## 2019-06-29 NOTE — Therapy (Signed)
[place], [place], [address] Phone: [phone]   Fax:  [phone]  Physical Therapy Treatment  Patient Details  Name: Micheal Mcgee MRN: [id number] Date of Birth: 10-13-1980 Referring Provider (PT): Jean Rosenthal, MD   Encounter Date: 06/29/2019  PT End of Session - 06/29/19 1436    Visit Number  11    Number of Visits  14    Date for PT Re-Evaluation  07/08/19    Authorization Type  Primary: Medicare; Secondary: Medicaid    Authorization Time Period  05/25/19 - 06/22/19; 06/17/19 - 07/08/19    Authorization - Visit Number  3    Authorization - Number of Visits  6    PT Start Time  [id number]    PT Stop Time  1512    PT Time Calculation (min)  38 min    Activity Tolerance  Patient tolerated treatment well    Behavior During Therapy  [facility] for tasks assessed/performed       Past Medical History:  Diagnosis Date  . Anxiety   . Depression     Past Surgical History:  Procedure Laterality Date  . MOLE REMOVAL    . ORIF CLAVICULAR FRACTURE Right 01/22/2019   Procedure: OPEN REDUCTION INTERNAL FIXATION (ORIF) RIGHT CLAVICLE FRACTURE;  Surgeon: Mcarthur Rossetti, MD;  Location: [facility];  Service: Orthopedics;  Laterality: Right;    There were no vitals filed for this visit.  Subjective Assessment - 06/29/19 1436    Subjective  Patient reported he is feeling alright and without pain.    Pertinent History  S/P RT clavicle ORIF 01/22/19    Limitations  Lifting;House hold activities    Diagnostic tests  X-ray    Patient Stated Goals  Move his arm better    Currently in Pain?  No/denies                       [facility] Adult PT Treatment/Exercise - 06/29/19 0001      Shoulder Exercises: Supine   Other Supine Exercises  Snow angels, supine on mat table x15 working on abduction AROM      Shoulder Exercises: Seated   Protraction  Strengthening;Right;Left;Weights;15 reps    Protraction Weight  (lbs)  2      Shoulder Exercises: Sidelying   Other Sidelying Exercises  RT active shoulder flexion x15       Shoulder Exercises: Standing   Flexion  Strengthening;Both;15 reps    Shoulder Flexion Weight (lbs)  2    Other Standing Exercises  Wall push ups x 15. Body blade with elbow tucked at side 20x Rt/Lt and 20x up/down for scapular stabilization      Manual Therapy   Manual Therapy  Soft tissue mobilization    Manual therapy comments  All manual completed separately from other skilled interventions    Soft tissue mobilization  STM to right periscapular mms, noted tightness and restrictions throughout               PT Short Term Goals - 06/17/19 1408      PT SHORT TERM GOAL #1   Title  Patient will report understanding and regular compliance with HEP to improve ROM, strength, and functional mobility.    Time  2    Period  Weeks    Status  Achieved    Target Date  06/08/19        PT Long Term Goals - 06/17/19  [place] #1   Title  Patient will demonstrate RT shoulder AROM WFL to perform household activities without difficulty.    Baseline  06/17/19: See objective measures    Time  4    Period  Weeks    Status  On-going      PT LONG TERM GOAL #2   Title  Patient will demonstrate 5/5 strength in RT shoulder without reports of pain to return to work duties without difficulty.    Time  4    Period  Weeks    Status  On-going      PT LONG TERM GOAL #3   Title  Patient will demonstrate improvement of at least 10% on FOTO indicating improved percieved functional mobility.    Time  4    Period  Weeks    Status  Achieved            Plan - 06/29/19 1448    Clinical Impression Statement  Continued with strengthening to improve serratus anterior strength. Progressed to serratus punches with dumbells this session. Also progressed to scapular stabilization with the Bodyblade this session. Patient required minimal cues to improve form with these  exercises. Patient would benefit from continued skilled physical therapy in order to continue progressing towards functional goals.    Personal Factors and Comorbidities  Comorbidity 2    Comorbidities  Anxiety, depression    Examination-Activity Limitations  Reach Overhead;Carry    Examination-Participation Restrictions  Yard Work;Cleaning;Meal Prep;Community Activity    Stability/Clinical Decision Making  Stable/Uncomplicated    Rehab Potential  Good    PT Frequency  2x / week    PT Duration  4 weeks    PT Treatment/Interventions  ADLs/Self Care Home Management;Cryotherapy;Electrical Stimulation;Iontophoresis 4mg /ml Dexamethasone;Moist Heat;Ultrasound;Functional mobility training;Therapeutic activities;Therapeutic exercise;Balance training;Neuromuscular re-education;Patient/family education;Manual techniques;Scar mobilization;Passive range of motion;Dry needling;Energy conservation;Taping    PT Next Visit Plan  Continue with strengthening exercises    PT Home Exercise Plan  05/25/19: Gentle flexion/abduction 15x; 05/27/19: AAROM flexion and abduction x10 each 1-2x/day; 06/10/19: posterior capsule stretch 3x30'' daily; 06/24/19: self-massage with tennis ball. ER and rows with theraband    Consulted and Agree with Plan of Care  Patient       Patient will benefit from skilled therapeutic intervention in order to improve the following deficits and impairments:  Increased fascial restricitons, Improper body mechanics, Pain, Decreased mobility, Decreased scar mobility, Postural dysfunction, Decreased activity tolerance, Decreased endurance, Decreased range of motion, Decreased strength, Hypomobility, Impaired UE functional use  Visit Diagnosis: Stiffness of right shoulder, not elsewhere classified  Muscle weakness (generalized)     Problem List Patient Active Problem List   Diagnosis Date Noted  . Right clavicle fracture 01/22/2019  . Closed displaced fracture of shaft of right clavicle  01/20/2019   Clarene Mcgee PT, DPT 3:25 PM, 06/29/19 [place], [place], [address] Phone: [phone]   Fax:  [phone]  Name: Micheal Mcgee MRN: [id number] Date of Birth: 04/09/1981

## 2019-07-01 ENCOUNTER — Ambulatory Visit (HOSPITAL_COMMUNITY): Payer: Medicare Other | Admitting: Physical Therapy

## 2019-07-06 ENCOUNTER — Encounter (HOSPITAL_COMMUNITY): Payer: Self-pay | Admitting: Physical Therapy

## 2019-07-06 ENCOUNTER — Ambulatory Visit (HOSPITAL_COMMUNITY): Payer: Medicare Other | Admitting: Physical Therapy

## 2019-07-06 ENCOUNTER — Other Ambulatory Visit: Payer: Self-pay

## 2019-07-06 DIAGNOSIS — M6281 Muscle weakness (generalized): Secondary | ICD-10-CM

## 2019-07-06 DIAGNOSIS — M25611 Stiffness of right shoulder, not elsewhere classified: Secondary | ICD-10-CM

## 2019-07-06 NOTE — Therapy (Signed)
[place], [place], [address] Phone: [phone]   Fax:  [phone]  Physical Therapy Treatment  Patient Details  Name: Micheal Mcgee MRN: [id number] Date of Birth: 03/27/1981 Referring Provider (PT): Jean Rosenthal, MD   Encounter Date: 07/06/2019  PT End of Session - 07/06/19 1444    Visit Number  12    Number of Visits  14    Date for PT Re-Evaluation  07/08/19    Authorization Type  Primary: Medicare; Secondary: Medicaid    Authorization Time Period  05/25/19 - 06/22/19; 06/17/19 - 07/08/19    Authorization - Visit Number  4    Authorization - Number of Visits  6    PT Start Time  [id number]    PT Stop Time  1517    PT Time Calculation (min)  38 min    Activity Tolerance  Patient tolerated treatment well    Behavior During Therapy  [facility] for tasks assessed/performed       Past Medical History:  Diagnosis Date  . Anxiety   . Depression     Past Surgical History:  Procedure Laterality Date  . MOLE REMOVAL    . ORIF CLAVICULAR FRACTURE Right 01/22/2019   Procedure: OPEN REDUCTION INTERNAL FIXATION (ORIF) RIGHT CLAVICLE FRACTURE;  Surgeon: Mcarthur Rossetti, MD;  Location: [facility];  Service: Orthopedics;  Laterality: Right;    There were no vitals filed for this visit.  Subjective Assessment - 07/06/19 1442    Subjective  Patient reported just minimal shoulder soreness, but is overall doing well.    Pertinent History  S/P RT clavicle ORIF 01/22/19    Limitations  Lifting;House hold activities    Diagnostic tests  X-ray    Patient Stated Goals  Move his arm better    Currently in Pain?  No/denies                       [facility] Adult PT Treatment/Exercise - 07/06/19 0001      Shoulder Exercises: Seated   Protraction  Strengthening;Right;Left;Weights;15 reps    Protraction Weight (lbs)  2      Shoulder Exercises: Standing   Flexion  Strengthening;Both;15 reps    Shoulder Flexion  Weight (lbs)  2    Other Standing Exercises  Snow angels at wall x15. Modified plantigrade push ups x 15. Body blade with elbow tucked at side 30x Rt/Lt and 30x up/down for scapular stabilization    Other Standing Exercises  Rebounder with yellow weighted ball toss 3x20 repetitions. Modified plantigrade rolling tennis ball on BOSU intermittently x 5 minutes. Bilateral shoulder horizontal abduction gently thumbs up/thumbs down x 1 minute. Wall wash 2x1 minute.                PT Short Term Goals - 06/17/19 1408      PT SHORT TERM GOAL #1   Title  Patient will report understanding and regular compliance with HEP to improve ROM, strength, and functional mobility.    Time  2    Period  Weeks    Status  Achieved    Target Date  06/08/19        PT Long Term Goals - 06/17/19 1422      PT LONG TERM GOAL #1   Title  Patient will demonstrate RT shoulder AROM WFL to perform household activities without difficulty.    Baseline  06/17/19: See objective measures    Time  4    Period  Weeks    Status  On-going      PT LONG TERM GOAL #2   Title  Patient will demonstrate 5/5 strength in RT shoulder without reports of pain to return to work duties without difficulty.    Time  4    Period  Weeks    Status  On-going      PT LONG TERM GOAL #3   Title  Patient will demonstrate improvement of at least 10% on FOTO indicating improved percieved functional mobility.    Time  4    Period  Weeks    Status  Achieved            Plan - 07/06/19 1536    Clinical Impression Statement  Continued progressing shoulder stability with muscular strengthening. This session progressed pushups from wall pushups to modified plantigrade pushups. Added rebounder, modified plantigrade tennis ball roll on BOSU, and horizontal abduction pulses. Patient reported some muscle fatigue, but not pain. Plan to re-assess patient next session.    Personal Factors and Comorbidities  Comorbidity 2    Comorbidities   Anxiety, depression    Examination-Activity Limitations  Reach Overhead;Carry    Examination-Participation Restrictions  Yard Work;Cleaning;Meal Prep;Community Activity    Stability/Clinical Decision Making  Stable/Uncomplicated    Rehab Potential  Good    PT Frequency  2x / week    PT Duration  4 weeks    PT Treatment/Interventions  ADLs/Self Care Home Management;Cryotherapy;Electrical Stimulation;Iontophoresis 4mg /ml Dexamethasone;Moist Heat;Ultrasound;Functional mobility training;Therapeutic activities;Therapeutic exercise;Balance training;Neuromuscular re-education;Patient/family education;Manual techniques;Scar mobilization;Passive range of motion;Dry needling;Energy conservation;Taping    PT Next Visit Plan  Re-evaluation    PT Home Exercise Plan  05/25/19: Gentle flexion/abduction 15x; 05/27/19: AAROM flexion and abduction x10 each 1-2x/day; 06/10/19: posterior capsule stretch 3x30'' daily; 06/24/19: self-massage with tennis ball. ER and rows with theraband    Consulted and Agree with Plan of Care  Patient       Patient will benefit from skilled therapeutic intervention in order to improve the following deficits and impairments:  Increased fascial restricitons, Improper body mechanics, Pain, Decreased mobility, Decreased scar mobility, Postural dysfunction, Decreased activity tolerance, Decreased endurance, Decreased range of motion, Decreased strength, Hypomobility, Impaired UE functional use  Visit Diagnosis: Stiffness of right shoulder, not elsewhere classified  Muscle weakness (generalized)     Problem List Patient Active Problem List   Diagnosis Date Noted  . Right clavicle fracture 01/22/2019  . Closed displaced fracture of shaft of right clavicle 01/20/2019   Clarene Critchley PT, DPT 3:38 PM, 07/06/19 [place], [place], [address] Phone: [phone]   Fax:  [phone]  Name: Micheal Mcgee MRN: [id number] Date of Birth: [birth year]

## 2019-07-08 ENCOUNTER — Other Ambulatory Visit: Payer: Self-pay

## 2019-07-08 ENCOUNTER — Encounter (HOSPITAL_COMMUNITY): Payer: Self-pay | Admitting: Physical Therapy

## 2019-07-08 ENCOUNTER — Ambulatory Visit (HOSPITAL_COMMUNITY): Payer: Medicare Other | Admitting: Physical Therapy

## 2019-07-08 DIAGNOSIS — M25611 Stiffness of right shoulder, not elsewhere classified: Secondary | ICD-10-CM

## 2019-07-08 DIAGNOSIS — M6281 Muscle weakness (generalized): Secondary | ICD-10-CM

## 2019-07-08 NOTE — Therapy (Signed)
[facility] [address] [place], [place], [address] Phone: [phone]   Fax:  [phone]  Physical Therapy Treatment / Re-evaluation/ Discharge Summary  Patient Details  Name: KHALID LACKO MRN: [id number] Date of Birth: Aug 28, 1980 Referring Provider (PT): Jean Rosenthal, MD   Encounter Date: 07/08/2019   PHYSICAL THERAPY DISCHARGE SUMMARY  Visits from Start of Care: 13  Current functional level related to goals / functional outcomes: See below   Remaining deficits: See below   Education / Equipment: HEP Plan: Patient agrees to discharge.  Patient goals were partially met. Patient is being discharged due to being pleased with the current functional level.  ?????     Progress Note Reporting Period 06/17/19 to 07/08/19  See note below for Objective Data and Assessment of Progress/Goals.        PT End of Session - 07/08/19 1129    Visit Number  13    Number of Visits  14    Date for PT Re-Evaluation  07/08/19    Authorization Type  Primary: Medicare; Secondary: Medicaid    Authorization Time Period  05/25/19 - 06/22/19; 06/17/19 - 07/08/19    Authorization - Visit Number  5    Authorization - Number of Visits  6    PT Start Time  1125    PT Stop Time  1145    PT Time Calculation (min)  20 min    Activity Tolerance  Patient tolerated treatment well    Behavior During Therapy  WFL for tasks assessed/performed       Past Medical History:  Diagnosis Date  . Anxiety   . Depression     Past Surgical History:  Procedure Laterality Date  . MOLE REMOVAL    . ORIF CLAVICULAR FRACTURE Right 01/22/2019   Procedure: OPEN REDUCTION INTERNAL FIXATION (ORIF) RIGHT CLAVICLE FRACTURE;  Surgeon: Mcarthur Rossetti, MD;  Location: [facility];  Service: Orthopedics;  Laterality: Right;    There were no vitals filed for this visit.  Subjective Assessment - 07/08/19 1127    Subjective  Patient reported that he is ready to be  discharged from therapy. He reported that he feels like he can do the things he needs to do. Reported not having pain, but can "feel it" sometimes.    Pertinent History  S/P RT clavicle ORIF 01/22/19    Limitations  Lifting;House hold activities    Diagnostic tests  X-ray    Patient Stated Goals  Move his arm better    Currently in Pain?  No/denies         [facility] PT Assessment - 07/08/19 0001      Assessment   Medical Diagnosis  S/P ORIF clavicle    Referring Provider (PT)  Jean Rosenthal, MD    Onset Date/Surgical Date  01/22/19      Prior Function   Level of Independence  Independent      Observation/Other Assessments   Focus on Therapeutic Outcomes (FOTO)   75.9%   was 62 then 77     AROM   Right Shoulder Extension  51 Degrees   was 50   Right Shoulder Flexion  140 Degrees   was 133   Right Shoulder ABduction  100 Degrees   was 90. Can get to 150 in scapular plane.   Right Shoulder External Rotation  80 Degrees   was 80     Strength   Right Shoulder Flexion  5/5    Right Shoulder  ABduction  5/5                           PT Education - 07/08/19 1156    Education Details  Discussed examination findings, updated HEP, and plan to DC.    Person(s) Educated  Patient    Methods  Handout    Comprehension  Verbalized understanding       PT Short Term Goals - 06/17/19 1408      PT SHORT TERM GOAL #1   Title  Patient will report understanding and regular compliance with HEP to improve ROM, strength, and functional mobility.    Time  2    Period  Weeks    Status  Achieved    Target Date  06/08/19        PT Long Term Goals - 07/08/19 1153      PT LONG TERM GOAL #1   Title  Patient will demonstrate RT shoulder AROM WFL to perform household activities without difficulty.    Baseline  07/08/19: See objective measures    Time  4    Period  Weeks    Status  Partially Met      PT LONG TERM GOAL #2   Title  Patient will demonstrate 5/5  strength in RT shoulder without reports of pain to return to work duties without difficulty.    Time  4    Period  Weeks    Status  Achieved      PT LONG TERM GOAL #3   Title  Patient will demonstrate improvement of at least 10% on FOTO indicating improved percieved functional mobility.    Time  4    Period  Weeks    Status  Achieved            Plan - 07/08/19 1200    Clinical Impression Statement  Performed re-assessment of patient's progress towards goals. Patient demonstrated overall good shoulder strength and demonstrated some improvements in shoulder AROM, however, patient continued to be limited particularly in shoulder abduction AROM. Educated patient on continuing HEP and updated HEP to include modified plantigrade push-ups. Discussed that as patient is pleased with current functional status he would be discharged from therapy, but that he should contact his MD for a new referral if he requires further therapy services in the future.    Personal Factors and Comorbidities  Comorbidity 2    Comorbidities  Anxiety, depression    Examination-Activity Limitations  Reach Overhead;Carry    Examination-Participation Restrictions  Yard Work;Cleaning;Meal Prep;Community Activity    Stability/Clinical Decision Making  Stable/Uncomplicated    Rehab Potential  Good    PT Frequency  2x / week    PT Duration  4 weeks    PT Treatment/Interventions  ADLs/Self Care Home Management;Cryotherapy;Electrical Stimulation;Iontophoresis '4mg'$ /ml Dexamethasone;Moist Heat;Ultrasound;Functional mobility training;Therapeutic activities;Therapeutic exercise;Balance training;Neuromuscular re-education;Patient/family education;Manual techniques;Scar mobilization;Passive range of motion;Dry needling;Energy conservation;Taping    PT Next Visit Plan  Discharged    PT Home Exercise Plan  05/25/19: Gentle flexion/abduction 15x; 05/27/19: AAROM flexion and abduction x10 each 1-2x/day; 06/10/19: posterior capsule stretch  3x30'' daily; 06/24/19: self-massage with tennis ball. ER and rows with theraband    Consulted and Agree with Plan of Care  Patient       Patient will benefit from skilled therapeutic intervention in order to improve the following deficits and impairments:  Increased fascial restricitons, Improper body mechanics, Pain, Decreased mobility, Decreased scar mobility, Postural dysfunction, Decreased activity tolerance,  Decreased endurance, Decreased range of motion, Decreased strength, Hypomobility, Impaired UE functional use  Visit Diagnosis: Stiffness of right shoulder, not elsewhere classified  Muscle weakness (generalized)     Problem List Patient Active Problem List   Diagnosis Date Noted  . Right clavicle fracture 01/22/2019  . Closed displaced fracture of shaft of right clavicle 01/20/2019   Clarene Critchley PT, DPT 12:03 PM, 07/08/19 [place], [place], [address] Phone: [phone]   Fax:  [phone]  Name: KRISHNA DANCEL MRN: [id number] Date of Birth: 1980-11-16

## 2019-07-12 ENCOUNTER — Encounter: Payer: Self-pay | Admitting: Orthopaedic Surgery

## 2019-07-12 ENCOUNTER — Ambulatory Visit (INDEPENDENT_AMBULATORY_CARE_PROVIDER_SITE_OTHER): Payer: Medicare Other

## 2019-07-12 ENCOUNTER — Other Ambulatory Visit: Payer: Self-pay

## 2019-07-12 ENCOUNTER — Ambulatory Visit (INDEPENDENT_AMBULATORY_CARE_PROVIDER_SITE_OTHER): Payer: Medicare Other | Admitting: Orthopaedic Surgery

## 2019-07-12 DIAGNOSIS — Z8781 Personal history of (healed) traumatic fracture: Secondary | ICD-10-CM

## 2019-07-12 DIAGNOSIS — Z9889 Other specified postprocedural states: Secondary | ICD-10-CM

## 2019-07-12 NOTE — Progress Notes (Signed)
   Office Visit Note   Patient: Micheal Mcgee           Date of Birth: 1980/10/17           MRN: [id number] Visit Date: 07/12/2019              Requested by: Carol Ada, [place],  [place] [address] PCP: Carol Ada, MD   Assessment & Plan: Visit Diagnoses:  1. S/P ORIF (open reduction internal fixation) fracture     Plan: He will continue to work on range of motion strengthening as shown by physical therapy.  Follow-up with [place] only as needed.  Questions encouraged and answered.  Recommend he continue the exercises for at least a year from the time of surgery.  Follow-Up Instructions: Return if symptoms worsen or fail to improve.   Orders:  Orders Placed This Encounter  Procedures  . XR Clavicle Right   No orders of the defined types were placed in this encounter.     Procedures: No procedures performed   Clinical Data: No additional findings.   Subjective: Chief Complaint  Patient presents with  . Right Shoulder - Follow-up    HPI Micheal Mcgee returns today follow-up status post open reduction internal fixation right clavicle fracture and 01/22/2019.  States overall that is range of motion strength are improving.  He is back at work.  States he has slight discomfort in the shoulder at times but no significant pain.  No radicular symptoms down the arm. Review of Systems See HPI otherwise negative or noncontributory  Objective: Vital Signs: There were no vitals taken for this visit.  Physical Exam General: Well-developed well-nourished male in no acute distress Ortho Exam Right shoulder: Surgical incisions healing well no signs of infection.  Nontender with palpation of the clavicle.  He has nearly full overhead activity the right shoulder.  He has 5 out of 5 strength external and internal rotation against resistance bilateral shoulders. Specialty Comments:  No specialty comments available.  Imaging: XR Clavicle Right  Result  Date: 07/12/2019 Right clavicle 2 views: No hardware failure or loosening.  Further consolidation of the fracture site seen.  No change in overall position alignment.    PMFS History: Patient Active Problem List   Diagnosis Date Noted  . Right clavicle fracture 01/22/2019  . Closed displaced fracture of shaft of right clavicle 01/20/2019   Past Medical History:  Diagnosis Date  . Anxiety   . Depression     Family History  Problem Relation Age of Onset  . Diabetes Mother   . Diabetes Father   . Cancer Father     Past Surgical History:  Procedure Laterality Date  . MOLE REMOVAL    . ORIF CLAVICULAR FRACTURE Right 01/22/2019   Procedure: OPEN REDUCTION INTERNAL FIXATION (ORIF) RIGHT CLAVICLE FRACTURE;  Surgeon: Mcarthur Rossetti, MD;  Location: [facility];  Service: Orthopedics;  Laterality: Right;   Social History   Occupational History  . Not on file  Tobacco Use  . Smoking status: Former Smoker    Types: Cigarettes    Quit date: 02/22/2004    Years since quitting: 15.3  . Smokeless tobacco: Former Systems developer    Types: Snuff    Quit date: 02/20/1999  Substance and Sexual Activity  . Alcohol use: Yes    Comment: occas  . Drug use: Never  . Sexual activity: Not on file

## 2019-12-27 DIAGNOSIS — Z0000 Encounter for general adult medical examination without abnormal findings: Secondary | ICD-10-CM | POA: Diagnosis not present

## 2019-12-27 DIAGNOSIS — Z1159 Encounter for screening for other viral diseases: Secondary | ICD-10-CM | POA: Diagnosis not present

## 2019-12-27 DIAGNOSIS — E785 Hyperlipidemia, unspecified: Secondary | ICD-10-CM | POA: Diagnosis not present

## 2019-12-31 DIAGNOSIS — E785 Hyperlipidemia, unspecified: Secondary | ICD-10-CM | POA: Diagnosis not present

## 2019-12-31 DIAGNOSIS — Z1159 Encounter for screening for other viral diseases: Secondary | ICD-10-CM | POA: Diagnosis not present

## 2020-01-05 ENCOUNTER — Encounter: Payer: Self-pay | Admitting: Psychiatry

## 2020-01-05 ENCOUNTER — Ambulatory Visit (INDEPENDENT_AMBULATORY_CARE_PROVIDER_SITE_OTHER): Payer: Medicare Other | Admitting: Psychiatry

## 2020-01-05 ENCOUNTER — Other Ambulatory Visit: Payer: Self-pay

## 2020-01-05 DIAGNOSIS — G4726 Circadian rhythm sleep disorder, shift work type: Secondary | ICD-10-CM

## 2020-01-05 DIAGNOSIS — F0630 Mood disorder due to known physiological condition, unspecified: Secondary | ICD-10-CM

## 2020-01-05 IMAGING — DX DG SHOULDER 2+V*R*
2 series · 2 of 2 positions shown · non-contrast
Comparison: None.

CLINICAL DATA: Fall pain

EXAM:
RIGHT CLAVICLE - 2+ VIEWS; RIGHT SHOULDER - 2+ VIEW

[shoulder grashey]
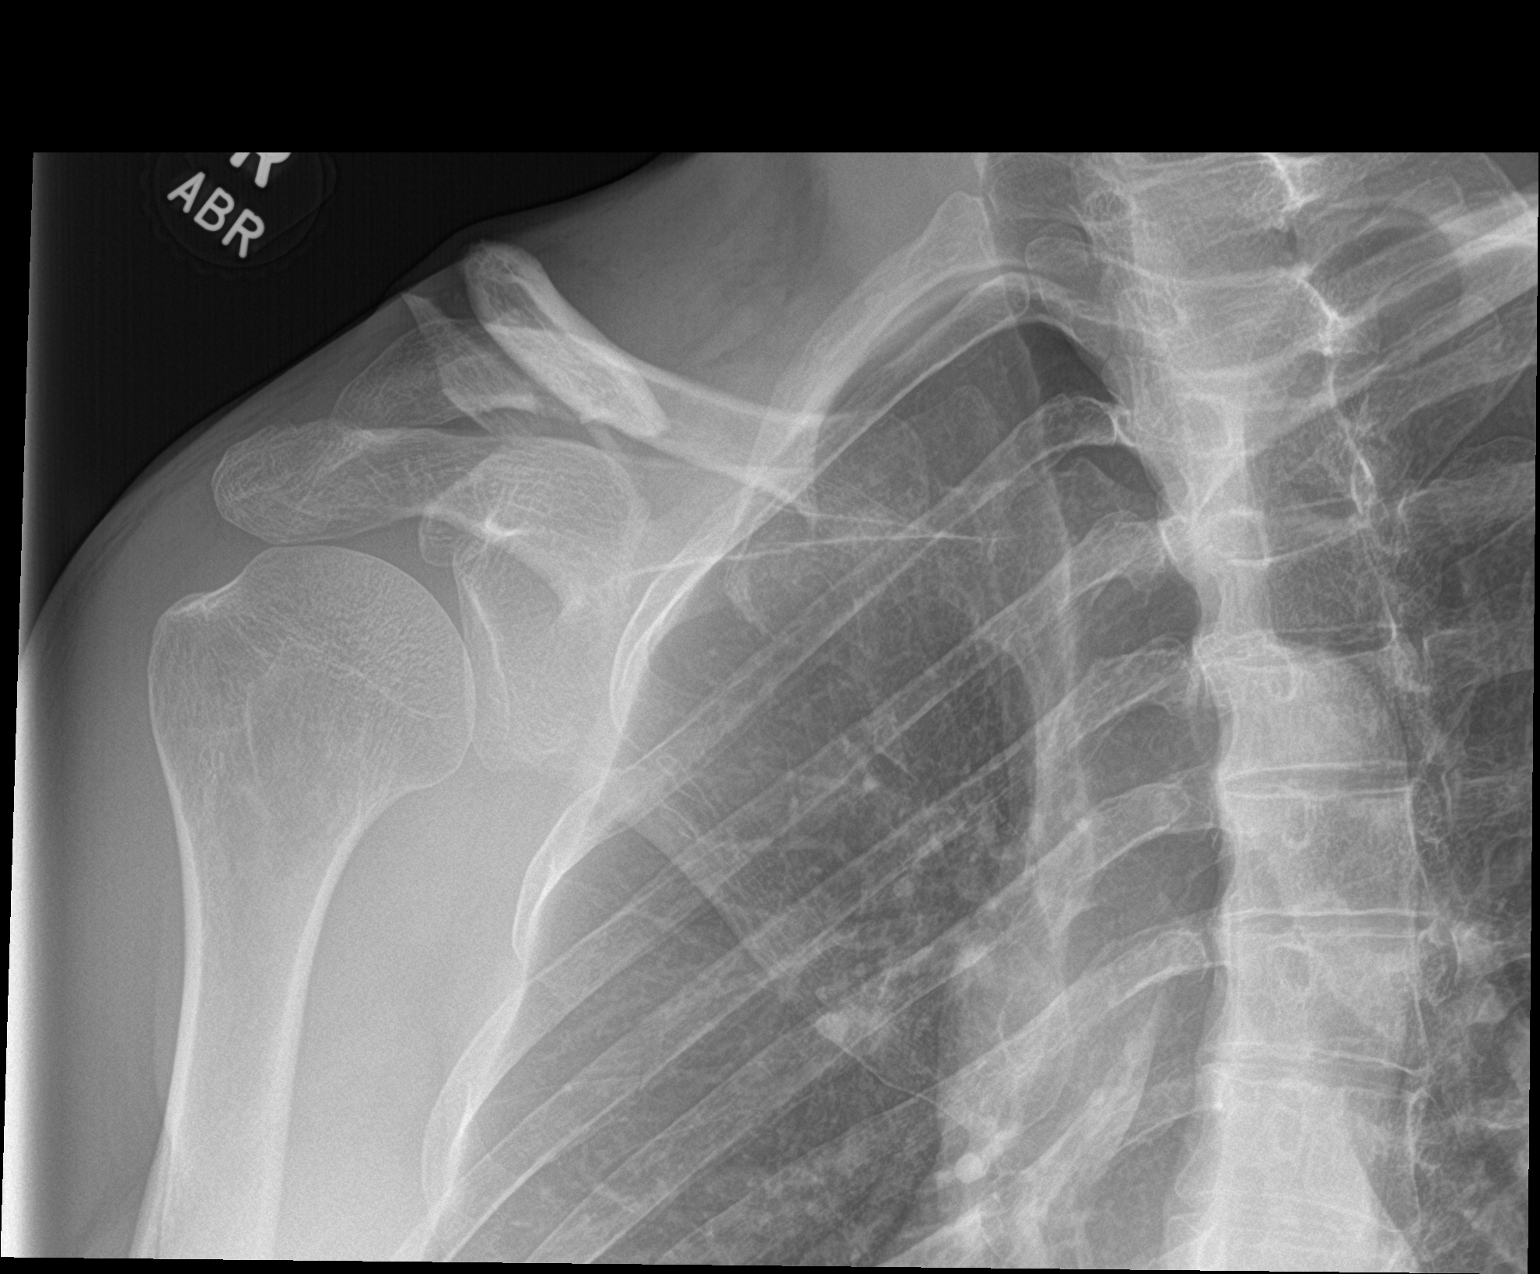

[shoulder y view]
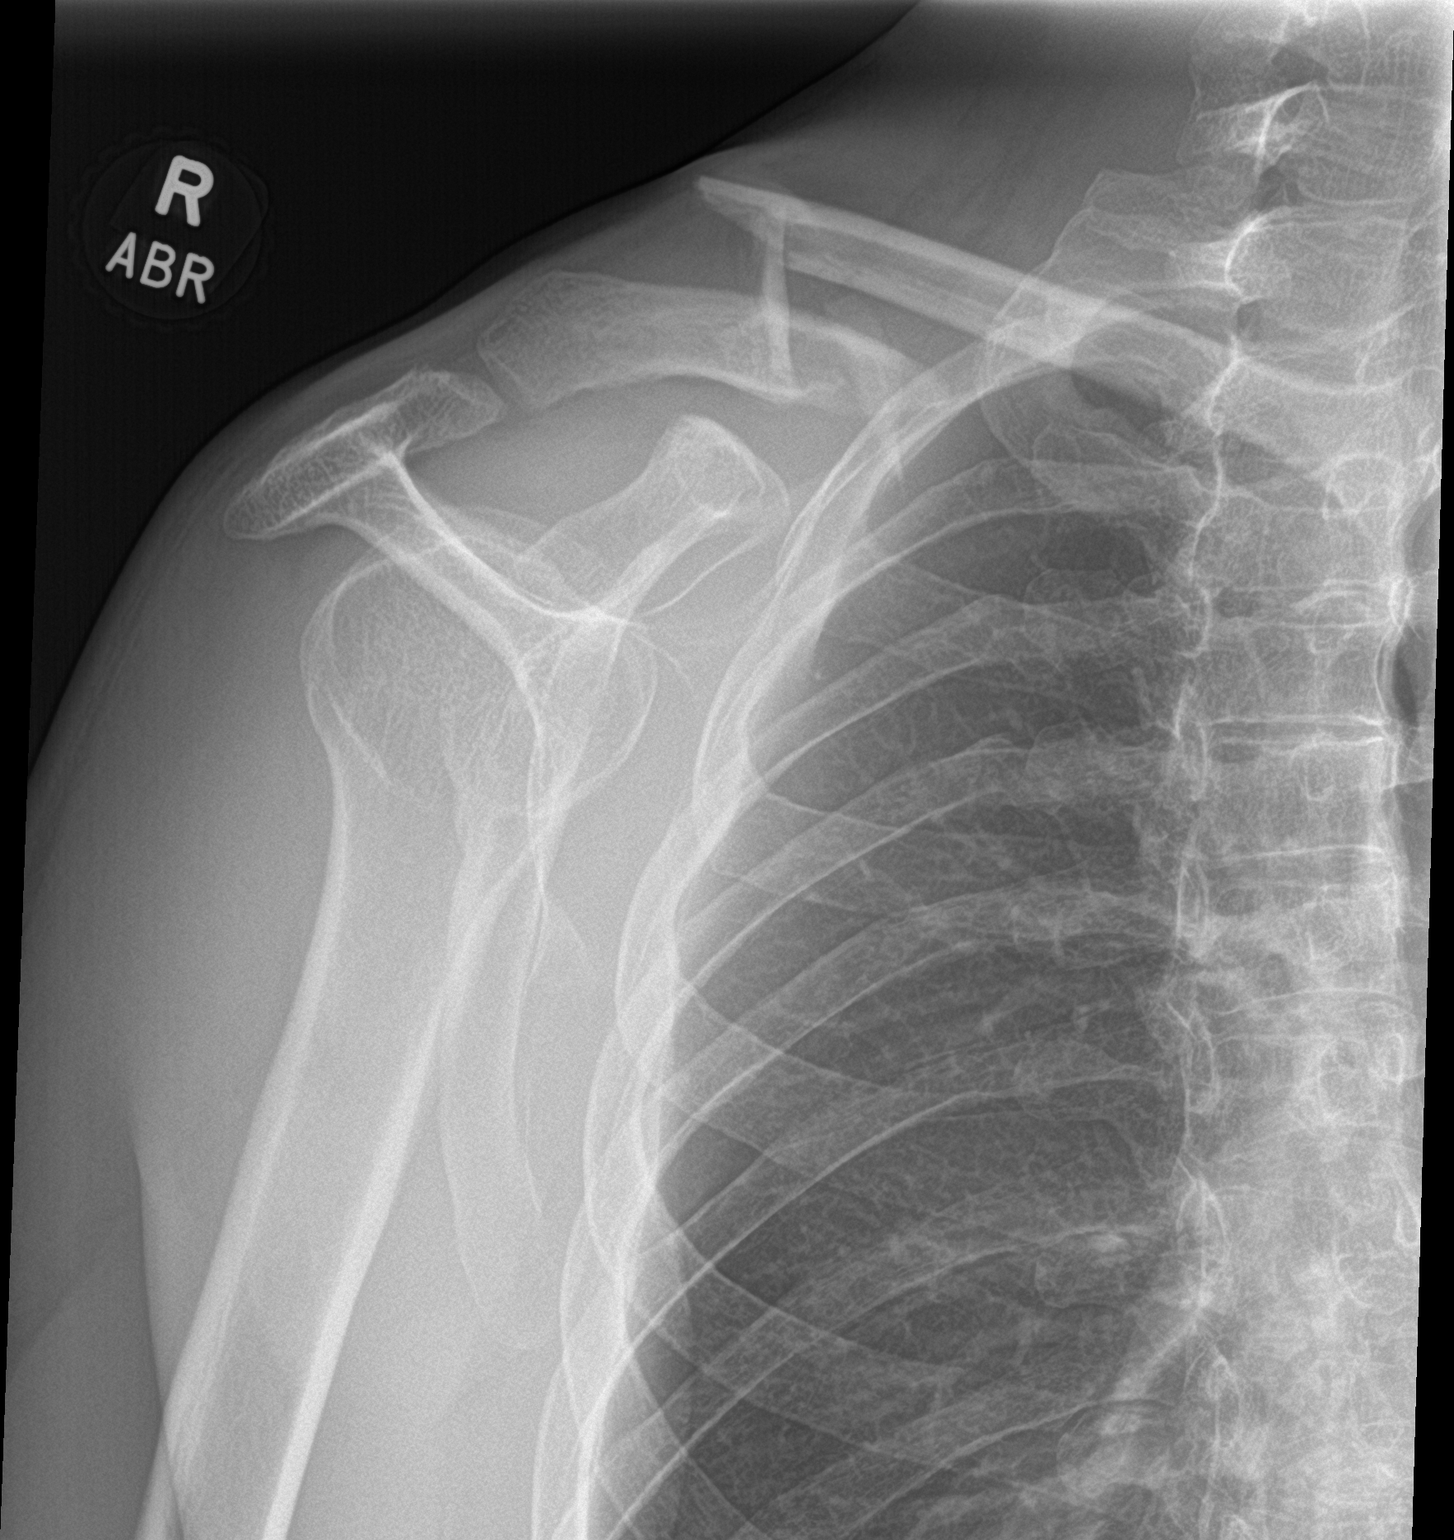

[2 of 2 positions shown; findings below may reference images not displayed]

FINDINGS: There is a comminuted mid clavicular shaft fracture with overlap of
fracture fragments and 1 shaft with inferior displacement of the
distal clavicle. There are several fracture fragments seen adjacent
to the fracture site. No AC joint separation is seen.

The glenohumeral joint appears to be maintained.
IMPRESSION: Comminuted midclavicular fracture with overlap of fracture fragments
and inferior displacement of the distal clavicle.

## 2020-01-05 MED ORDER — MODAFINIL 100 MG PO TABS: 100.0000 mg | ORAL_TABLET | Freq: Every day | ORAL | 1 refills | Status: DC

## 2020-01-05 MED ORDER — FLUOXETINE HCL 10 MG PO CAPS: 10.0000 mg | ORAL_CAPSULE | Freq: Every day | ORAL | 3 refills | Status: DC
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
## 2020-01-08 IMAGING — RF DG CLAVICLE*R*
1 series · 3 of 3 positions shown · non-contrast
Comparison: Radiographs dated 01/19/2019

CLINICAL DATA: Right clavicle fracture.

EXAM:
RIGHT CLAVICLE - 2+ VIEWS; DG C-ARM 1-60 MIN-NO REPORT

[Series 1: unknown protocol · 0.20mm/px · 3 of 3 slices shown]
[im 1/3]
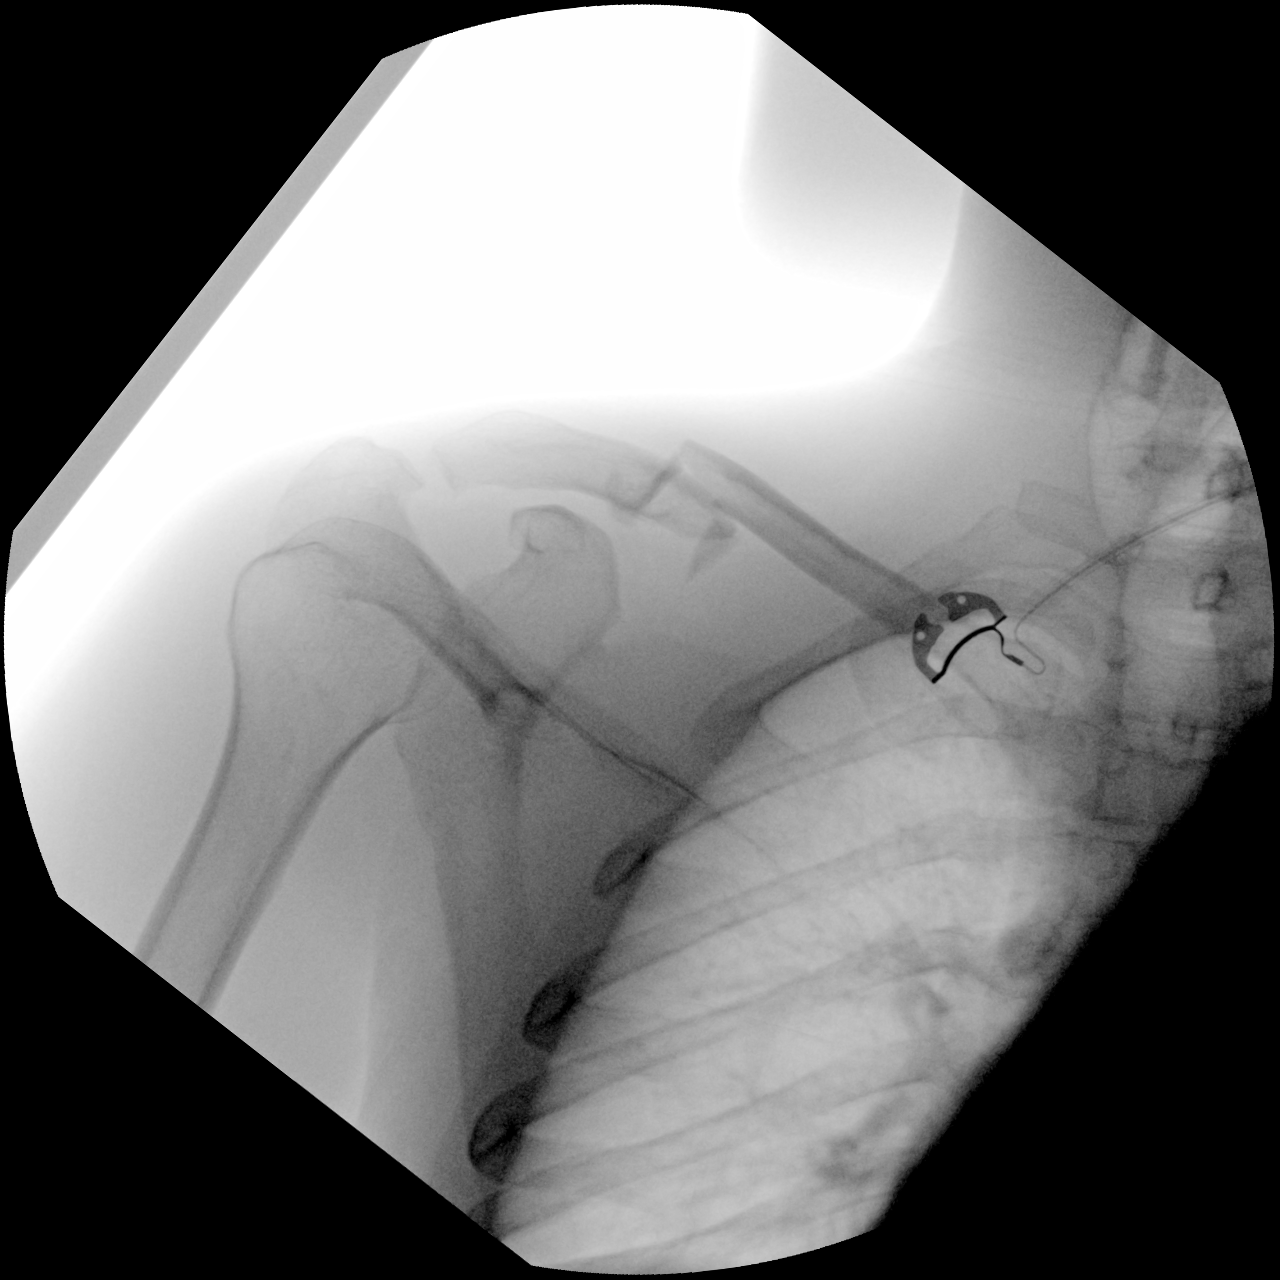
[im 2/3]
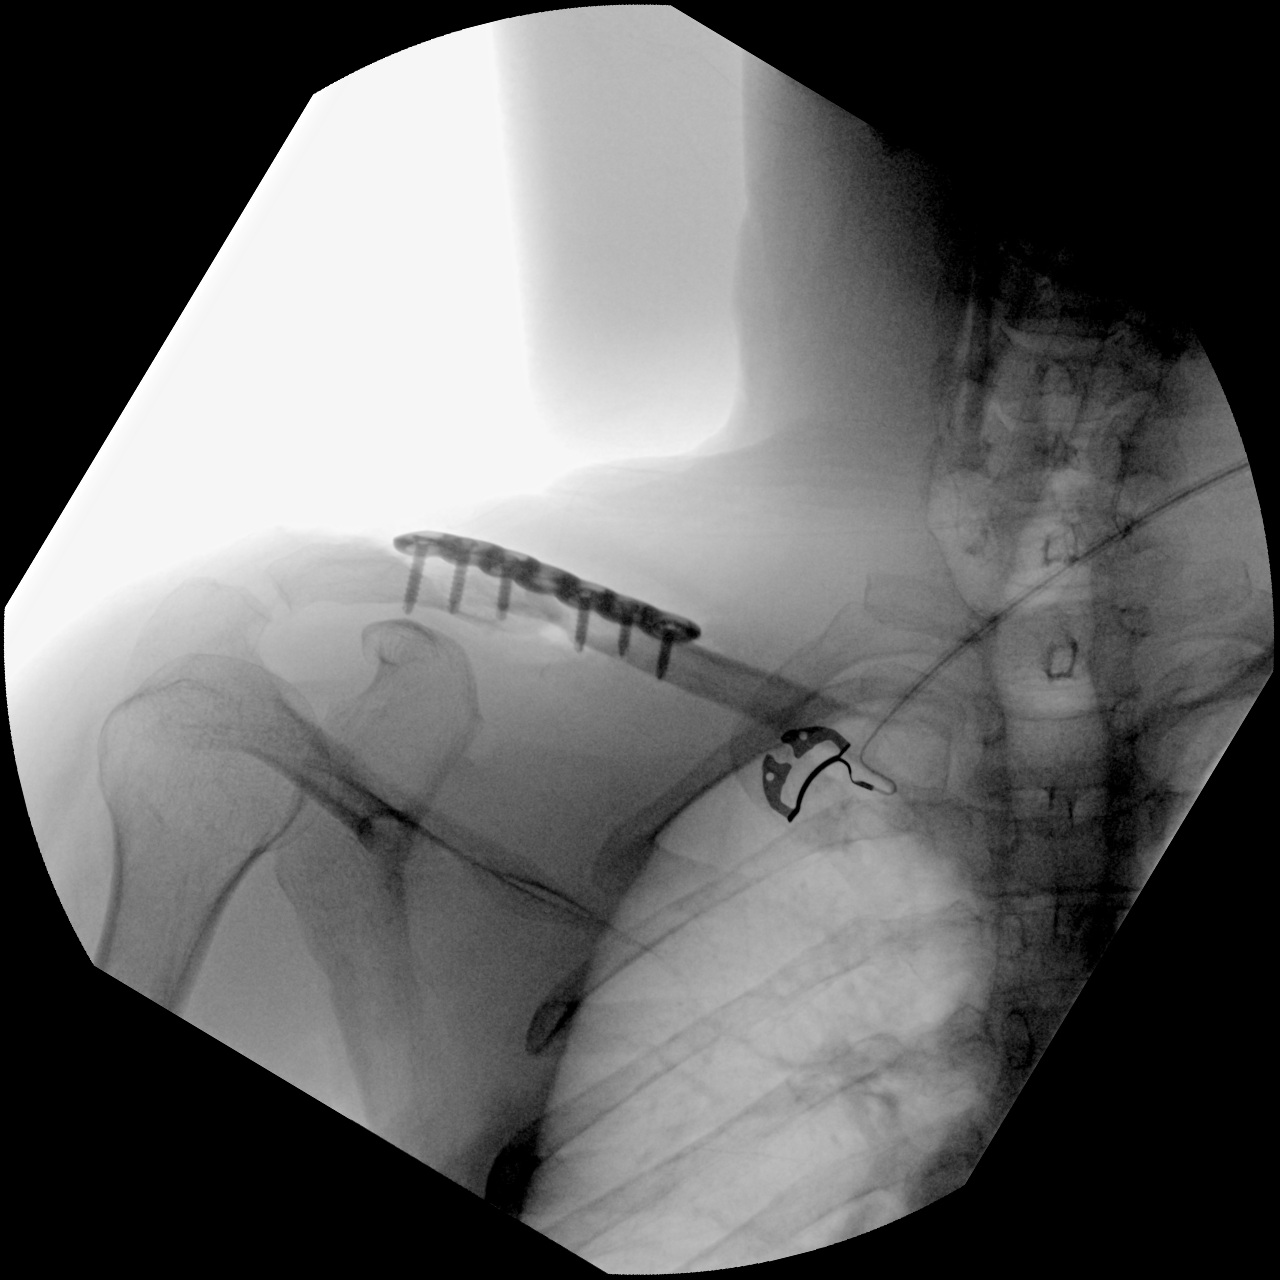
[im 3/3]
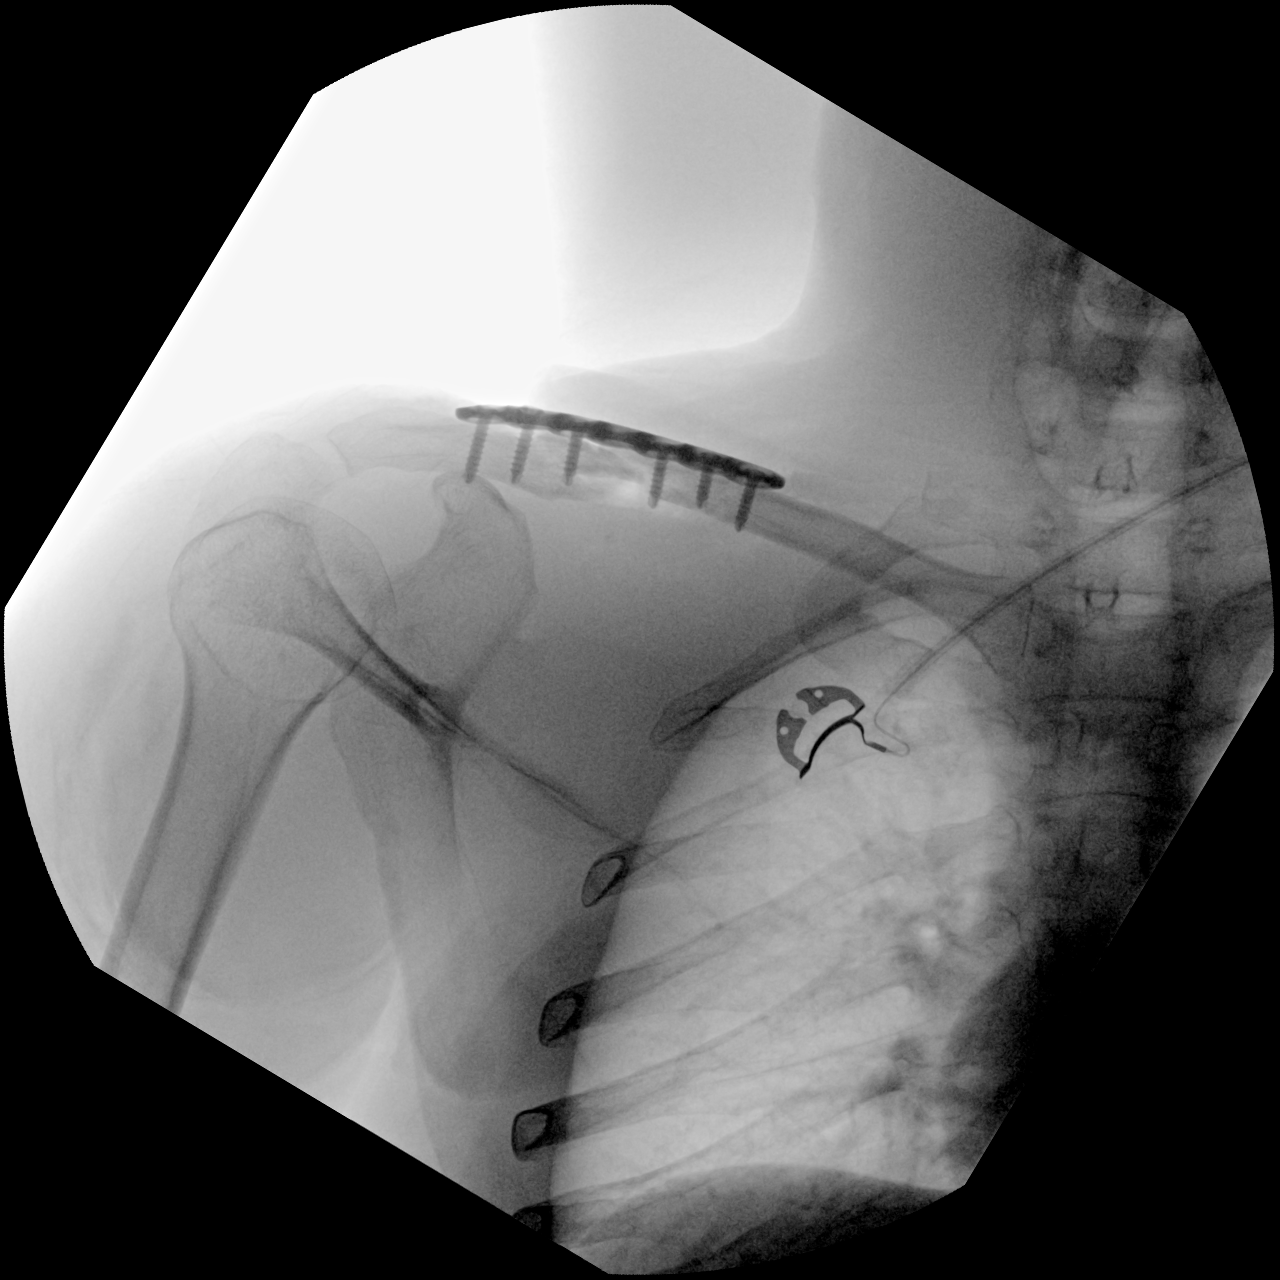

[3 of 3 positions shown; findings below may reference images not displayed]

FINDINGS: Multiple C-arm images demonstrate the patient undergoing open
reduction and internal fixation of the comminuted fracture of the
right clavicular shaft. A plate and 6 screws have been inserted.
Alignment and position is markedly improved.
IMPRESSION: Open reduction and internal fixation of right clavicle fracture.

FLUOROSCOPY TIME:  13 seconds

C-arm fluoroscopic images were obtained intraoperatively and
submitted for post operative interpretation.

## 2020-01-08 IMAGING — RF DG C-ARM 1-60 MIN-NO REPORT
1 series · 3 of 3 positions shown · non-contrast
Comparison: Radiographs dated 01/19/2019

CLINICAL DATA: Right clavicle fracture.

EXAM:
RIGHT CLAVICLE - 2+ VIEWS; DG C-ARM 1-60 MIN-NO REPORT

[Series 1: unknown protocol · 0.20mm/px · 3 of 3 slices shown]
[im 1/3]
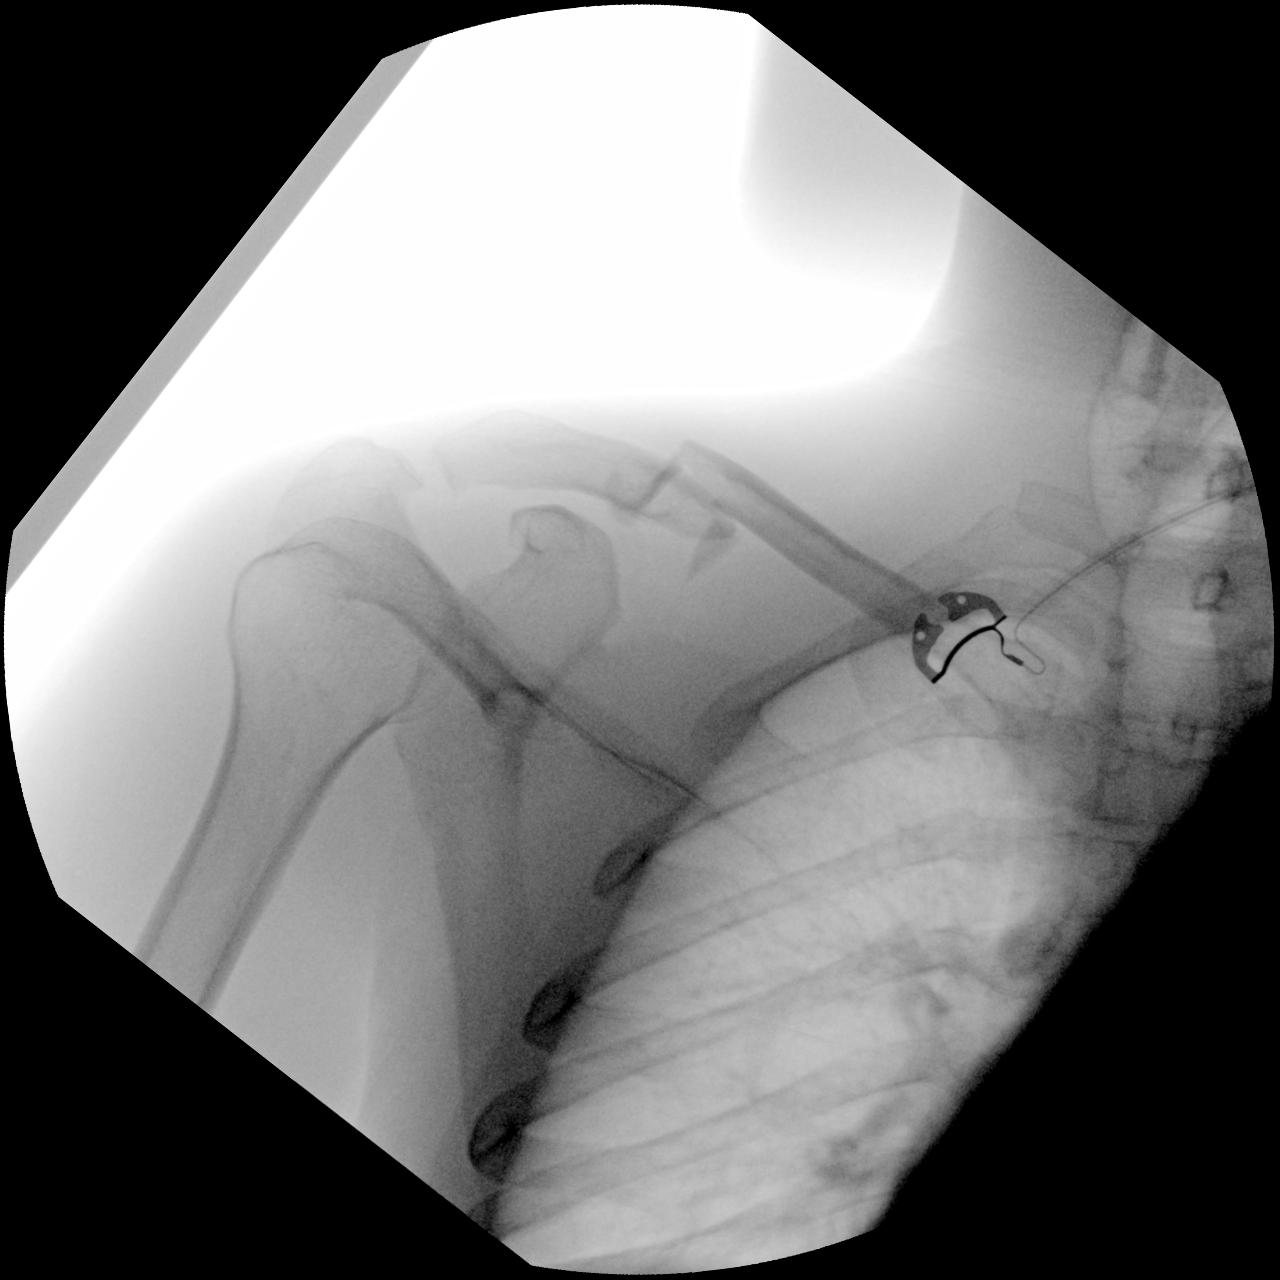
[im 2/3]
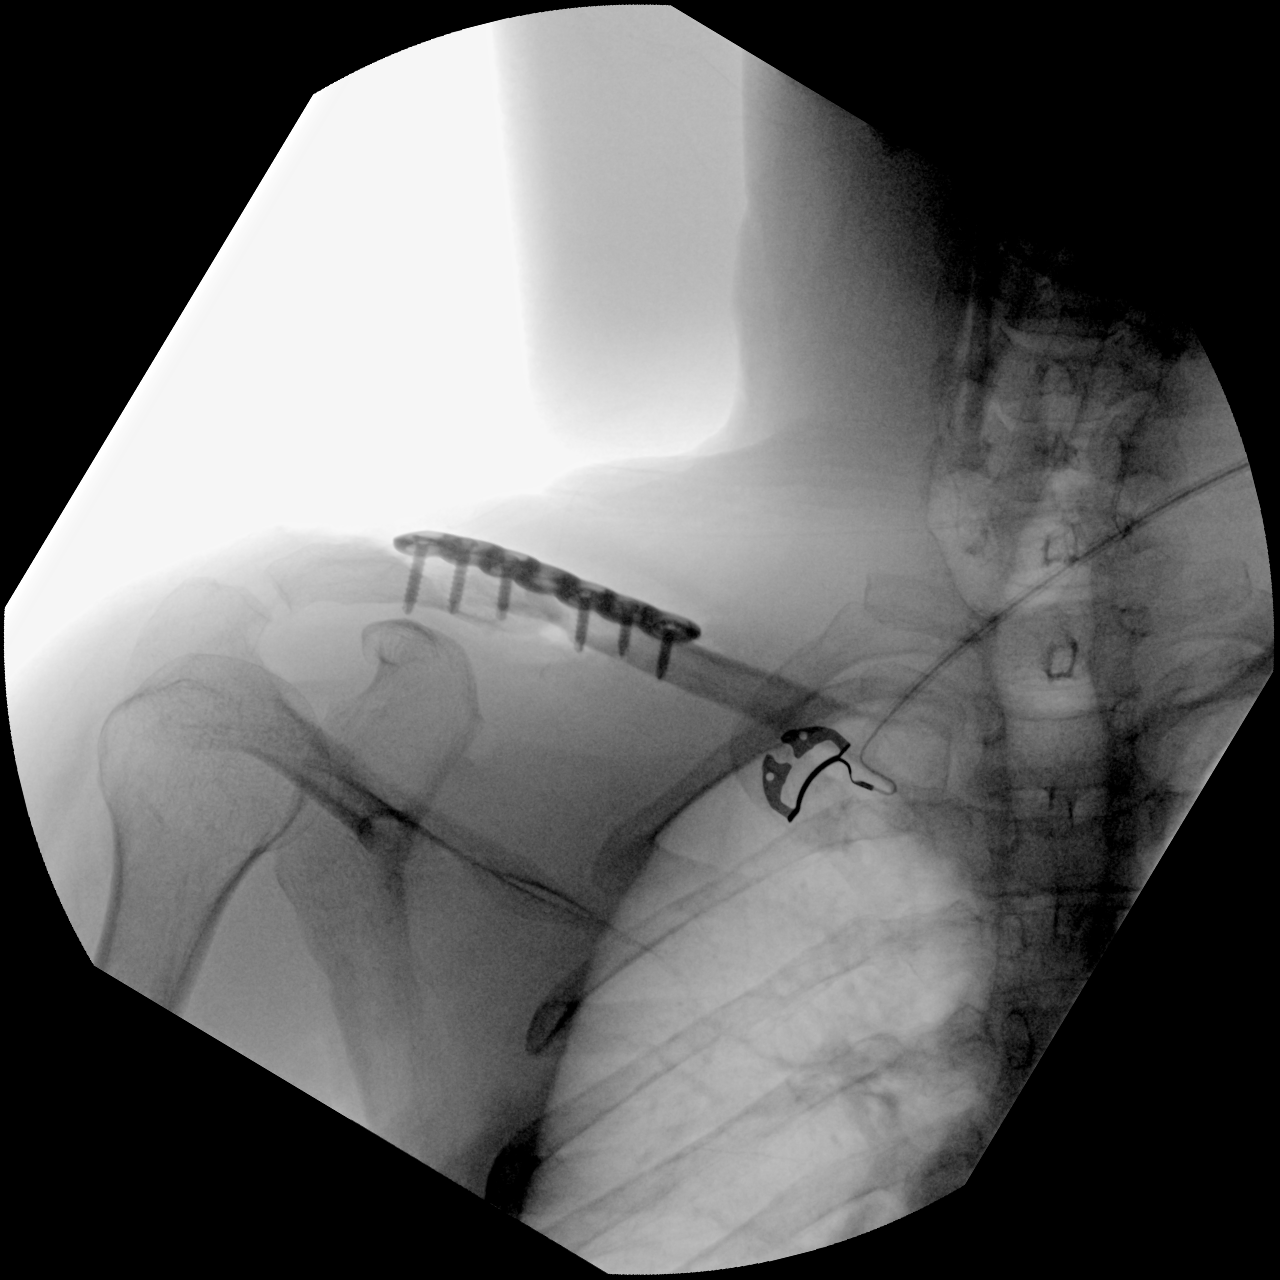
[im 3/3]
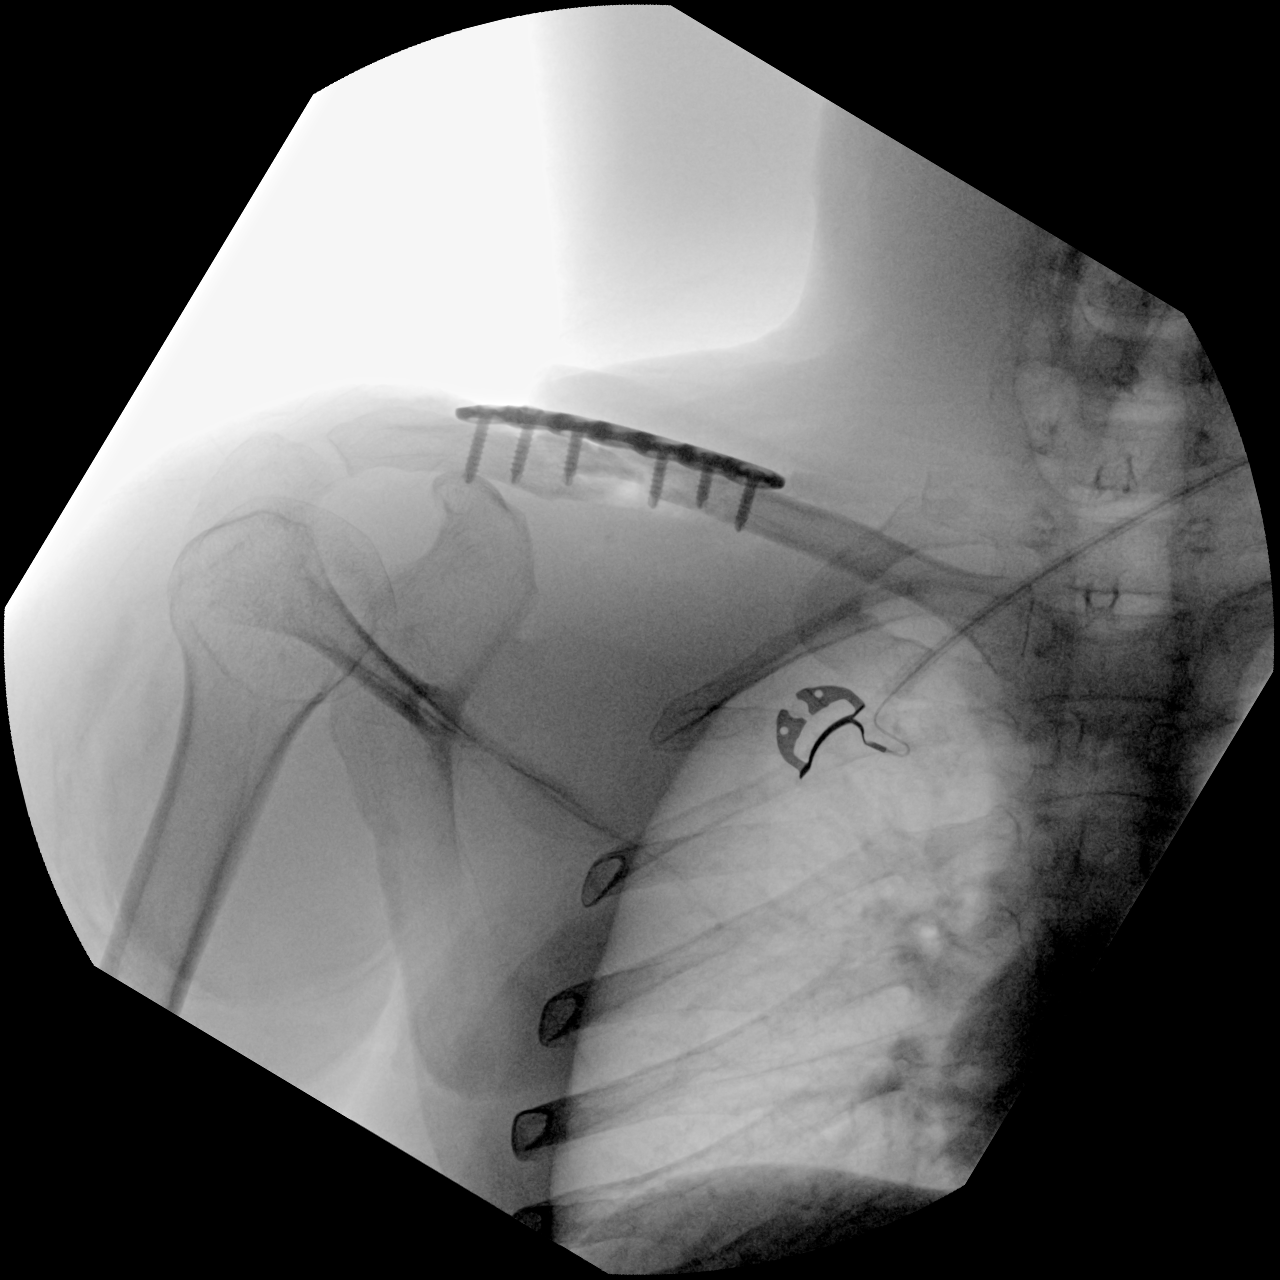

[3 of 3 positions shown; findings below may reference images not displayed]

FINDINGS: Multiple C-arm images demonstrate the patient undergoing open
reduction and internal fixation of the comminuted fracture of the
right clavicular shaft. A plate and 6 screws have been inserted.
Alignment and position is markedly improved.
IMPRESSION: Open reduction and internal fixation of right clavicle fracture.

FLUOROSCOPY TIME:  13 seconds

C-arm fluoroscopic images were obtained intraoperatively and
submitted for post operative interpretation.

## 2020-02-10 DIAGNOSIS — Z23 Encounter for immunization: Secondary | ICD-10-CM | POA: Diagnosis not present

## 2021-01-03 ENCOUNTER — Ambulatory Visit (INDEPENDENT_AMBULATORY_CARE_PROVIDER_SITE_OTHER): Payer: Medicare Other | Admitting: Psychiatry

## 2021-01-03 ENCOUNTER — Encounter: Payer: Self-pay | Admitting: Psychiatry

## 2021-01-03 ENCOUNTER — Other Ambulatory Visit: Payer: Self-pay

## 2021-01-03 DIAGNOSIS — F0630 Mood disorder due to known physiological condition, unspecified: Secondary | ICD-10-CM | POA: Diagnosis not present

## 2021-01-03 DIAGNOSIS — G4726 Circadian rhythm sleep disorder, shift work type: Secondary | ICD-10-CM

## 2021-01-03 DIAGNOSIS — F959 Tic disorder, unspecified: Secondary | ICD-10-CM | POA: Diagnosis not present

## 2021-01-03 MED ORDER — FLUOXETINE HCL 10 MG PO CAPS: 10.0000 mg | ORAL_CAPSULE | Freq: Every day | ORAL | 3 refills | Status: AC
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
## 2021-01-03 NOTE — Patient Instructions (Signed)
[email or web address]

## 2021-02-20 DIAGNOSIS — Z23 Encounter for immunization: Secondary | ICD-10-CM | POA: Diagnosis not present

## 2021-12-20 ENCOUNTER — Ambulatory Visit: Payer: Medicare Other | Admitting: Psychiatry

## 2022-01-03 ENCOUNTER — Ambulatory Visit: Payer: Medicare Other | Admitting: Psychiatry

## 2023-02-10 DIAGNOSIS — Z23 Encounter for immunization: Secondary | ICD-10-CM | POA: Diagnosis not present

## 2024-02-12 DIAGNOSIS — Z23 Encounter for immunization: Secondary | ICD-10-CM | POA: Diagnosis not present
